# Patient Record
Sex: Female | Born: 1980 | Race: White | Hispanic: No | Marital: Married | Smoking: Never smoker
Health system: Southern US, Community
[De-identification: ages and names within clinical notes are randomized; demographics above are authoritative.]

## PROBLEM LIST (undated history)

## (undated) DIAGNOSIS — Z1501 Genetic susceptibility to malignant neoplasm of breast: Secondary | ICD-10-CM

## (undated) DIAGNOSIS — Z1509 Genetic susceptibility to other malignant neoplasm: Secondary | ICD-10-CM

## (undated) DIAGNOSIS — K120 Recurrent oral aphthae: Secondary | ICD-10-CM

## (undated) HISTORY — PX: NASAL SINUS SURGERY: SHX719

---

## 2010-02-18 ENCOUNTER — Ambulatory Visit: Payer: Self-pay | Admitting: Internal Medicine

## 2010-04-09 ENCOUNTER — Ambulatory Visit: Payer: Self-pay | Admitting: Otolaryngology

## 2011-10-22 ENCOUNTER — Inpatient Hospital Stay: Payer: Self-pay | Admitting: Obstetrics and Gynecology

## 2011-10-22 LAB — CBC WITH DIFFERENTIAL/PLATELET
Basophil %: 0.3 %
Eosinophil #: 0.1 10*3/uL (ref 0.0–0.7)
Eosinophil %: 0.9 %
HGB: 12.5 g/dL (ref 12.0–16.0)
Lymphocyte %: 24.8 %
Monocyte #: 1 10*3/uL — ABNORMAL HIGH (ref 0.0–0.7)
Monocyte %: 9.2 %
Neutrophil #: 7.4 10*3/uL — ABNORMAL HIGH (ref 1.4–6.5)
Neutrophil %: 64.8 %
Platelet: 206 10*3/uL (ref 150–440)
RBC: 3.9 10*6/uL (ref 3.80–5.20)
WBC: 11.4 10*3/uL — ABNORMAL HIGH (ref 3.6–11.0)

## 2011-10-23 LAB — COMPREHENSIVE METABOLIC PANEL
Albumin: 2.7 g/dL — ABNORMAL LOW (ref 3.4–5.0)
Anion Gap: 14 (ref 7–16)
BUN: 5 mg/dL — ABNORMAL LOW (ref 7–18)
Bilirubin,Total: 0.8 mg/dL (ref 0.2–1.0)
Chloride: 105 mmol/L (ref 98–107)
Co2: 20 mmol/L — ABNORMAL LOW (ref 21–32)
EGFR (African American): >60
EGFR (Non-African Amer.): >60
SGOT(AST): 36 U/L (ref 15–37)
SGPT (ALT): 23 U/L
Total Protein: 6.7 g/dL (ref 6.4–8.2)

## 2011-10-23 LAB — CBC
MCH: 32.3 pg (ref 26.0–34.0)
MCHC: 34.3 g/dL (ref 32.0–36.0)
MCV: 94 fL (ref 80–100)
Platelet: 201 10*3/uL (ref 150–440)
RDW: 13.6 % (ref 11.5–14.5)

## 2011-10-23 LAB — PROTEIN / CREATININE RATIO, URINE: Creatinine, Urine: 34.8 mg/dL (ref 30.0–125.0)

## 2014-07-12 LAB — OB RESULTS CONSOLE ANTIBODY SCREEN: ANTIBODY SCREEN: NEGATIVE

## 2014-07-12 LAB — OB RESULTS CONSOLE RPR: RPR: NONREACTIVE

## 2014-07-12 LAB — OB RESULTS CONSOLE HIV ANTIBODY (ROUTINE TESTING): HIV: NONREACTIVE

## 2014-07-12 LAB — OB RESULTS CONSOLE RUBELLA ANTIBODY, IGM: Rubella: IMMUNE

## 2014-07-12 LAB — OB RESULTS CONSOLE VARICELLA ZOSTER ANTIBODY, IGG: Varicella: IMMUNE

## 2014-07-12 LAB — OB RESULTS CONSOLE HEPATITIS B SURFACE ANTIGEN: Hepatitis B Surface Ag: NEGATIVE

## 2014-08-07 LAB — OB RESULTS CONSOLE ABO/RH: RH TYPE: POSITIVE

## 2014-08-07 LAB — OB RESULTS CONSOLE RUBELLA ANTIBODY, IGM: Rubella: IMMUNE

## 2014-08-07 LAB — OB RESULTS CONSOLE VARICELLA ZOSTER ANTIBODY, IGG: Varicella: IMMUNE

## 2014-08-09 ENCOUNTER — Other Ambulatory Visit: Payer: Self-pay | Admitting: Obstetrics & Gynecology

## 2014-08-09 DIAGNOSIS — Z1509 Genetic susceptibility to other malignant neoplasm: Secondary | ICD-10-CM

## 2014-08-09 DIAGNOSIS — Z1501 Genetic susceptibility to malignant neoplasm of breast: Secondary | ICD-10-CM

## 2014-08-09 DIAGNOSIS — Z803 Family history of malignant neoplasm of breast: Secondary | ICD-10-CM

## 2014-08-10 NOTE — L&D Delivery Note (Signed)
VAGINAL DELIVERY NOTE:  Date of Delivery: 01/19/2015 Primary OB: [facility]  Gestational Age/EDD: [redacted]w[redacted]d 02/22/2015, Alternate EDD Entry Antepartum complications: Twins, IUGR, 35 weeks Attending Physician: Barnett Applebaum, MD, FACOG Delivery Type: primary cesarean section, low transverse incision  Anesthesia: spinal Laceration: none Episiotomy: none Placenta: expressed Intrapartum complications: None Estimated Blood Loss: 250 mL GBS: unknown Procedure Details: Vtx/Vtx twin deliv ery by CS Baby: Liveborn female x2, Apgars 9/9, weight 4-8, 3-3

## 2014-10-11 ENCOUNTER — Encounter: Payer: Self-pay | Admitting: Maternal & Fetal Medicine

## 2014-10-18 ENCOUNTER — Encounter: Payer: Self-pay | Admitting: Obstetrics & Gynecology

## 2014-11-01 ENCOUNTER — Encounter: Payer: Self-pay | Admitting: Maternal & Fetal Medicine

## 2014-11-15 ENCOUNTER — Encounter
Admit: 2014-11-15 | Disposition: A | Discharge status: Home / Self Care | Payer: Self-pay | Attending: Maternal & Fetal Medicine | Admitting: Maternal & Fetal Medicine

## 2014-11-22 ENCOUNTER — Encounter
Admit: 2014-11-22 | Disposition: A | Discharge status: Home / Self Care | Payer: Self-pay | Attending: Obstetrics & Gynecology | Admitting: Obstetrics & Gynecology

## 2014-11-22 DIAGNOSIS — R079 Chest pain, unspecified: Secondary | ICD-10-CM | POA: Insufficient documentation

## 2014-11-22 DIAGNOSIS — R011 Cardiac murmur, unspecified: Secondary | ICD-10-CM | POA: Insufficient documentation

## 2014-11-22 DIAGNOSIS — R002 Palpitations: Secondary | ICD-10-CM | POA: Insufficient documentation

## 2014-11-23 ENCOUNTER — Other Ambulatory Visit: Payer: Self-pay | Admitting: Maternal & Fetal Medicine

## 2014-11-23 DIAGNOSIS — Z3689 Encounter for other specified antenatal screening: Secondary | ICD-10-CM

## 2014-11-23 DIAGNOSIS — O30039 Twin pregnancy, monochorionic/diamniotic, unspecified trimester: Secondary | ICD-10-CM

## 2014-11-23 DIAGNOSIS — IMO0001 Reserved for inherently not codable concepts without codable children: Secondary | ICD-10-CM

## 2014-11-27 LAB — OB RESULTS CONSOLE HGB/HCT, BLOOD
HCT: 38 %
Hemoglobin: 12.9 g/dL

## 2014-11-27 LAB — OB RESULTS CONSOLE PLATELET COUNT: Platelets: 248 10*3/uL

## 2014-11-29 ENCOUNTER — Encounter
Admit: 2014-11-29 | Disposition: A | Discharge status: Home / Self Care | Payer: Self-pay | Attending: Maternal & Fetal Medicine | Admitting: Maternal & Fetal Medicine

## 2014-12-05 ENCOUNTER — Ambulatory Visit: Payer: Self-pay | Admitting: Cardiovascular Disease

## 2014-12-06 ENCOUNTER — Encounter
Admit: 2014-12-06 | Disposition: A | Discharge status: Home / Self Care | Payer: Self-pay | Attending: Obstetrics and Gynecology | Admitting: Obstetrics and Gynecology

## 2014-12-10 ENCOUNTER — Ambulatory Visit
Admission: RE | Admit: 2014-12-10 | Discharge: 2014-12-10 | Disposition: A | Discharge status: Home / Self Care | Payer: Medicaid Other | Source: Ambulatory Visit | Attending: Maternal & Fetal Medicine | Admitting: Maternal & Fetal Medicine

## 2014-12-12 ENCOUNTER — Other Ambulatory Visit: Payer: Self-pay | Admitting: Maternal & Fetal Medicine

## 2014-12-12 DIAGNOSIS — Z3689 Encounter for other specified antenatal screening: Secondary | ICD-10-CM

## 2014-12-12 DIAGNOSIS — IMO0001 Reserved for inherently not codable concepts without codable children: Secondary | ICD-10-CM

## 2014-12-13 ENCOUNTER — Ambulatory Visit
Admission: RE | Admit: 2014-12-13 | Discharge: 2014-12-13 | Disposition: A | Discharge status: Home / Self Care | Payer: BLUE CROSS/BLUE SHIELD | Source: Ambulatory Visit | Attending: Obstetrics and Gynecology | Admitting: Obstetrics and Gynecology

## 2014-12-13 ENCOUNTER — Ambulatory Visit: Payer: BLUE CROSS/BLUE SHIELD

## 2014-12-13 VITALS — BP 115/68 | HR 83 | Temp 98.2°F | Wt 168.0 lb

## 2014-12-13 DIAGNOSIS — IMO0002 Reserved for concepts with insufficient information to code with codable children: Secondary | ICD-10-CM

## 2014-12-13 DIAGNOSIS — Z3A29 29 weeks gestation of pregnancy: Secondary | ICD-10-CM | POA: Diagnosis not present

## 2014-12-13 DIAGNOSIS — O30039 Twin pregnancy, monochorionic/diamniotic, unspecified trimester: Secondary | ICD-10-CM | POA: Diagnosis present

## 2014-12-13 DIAGNOSIS — O30033 Twin pregnancy, monochorionic/diamniotic, third trimester: Secondary | ICD-10-CM | POA: Diagnosis not present

## 2014-12-17 ENCOUNTER — Ambulatory Visit
Admission: RE | Admit: 2014-12-17 | Discharge: 2014-12-17 | Disposition: A | Discharge status: Home / Self Care | Payer: BLUE CROSS/BLUE SHIELD | Source: Ambulatory Visit | Attending: Obstetrics & Gynecology | Admitting: Obstetrics & Gynecology

## 2014-12-17 VITALS — BP 115/76 | HR 78 | Temp 98.0°F | Wt 164.0 lb

## 2014-12-17 DIAGNOSIS — Z3A30 30 weeks gestation of pregnancy: Secondary | ICD-10-CM | POA: Insufficient documentation

## 2014-12-17 DIAGNOSIS — O30033 Twin pregnancy, monochorionic/diamniotic, third trimester: Secondary | ICD-10-CM

## 2014-12-17 DIAGNOSIS — IMO0002 Reserved for concepts with insufficient information to code with codable children: Secondary | ICD-10-CM

## 2014-12-17 DIAGNOSIS — O26849 Uterine size-date discrepancy, unspecified trimester: Secondary | ICD-10-CM | POA: Insufficient documentation

## 2014-12-17 NOTE — Progress Notes (Signed)
[facility] NST: Pt presents at 30 3/7 weeks for NST in setting of monochorionic-diamniotic twin gestation with fetal growth restriction of Twin B. She reports excellent fetal movement.  Twin A: Baseline 130s, moderate variability, reactive, no decels Twin B: Unable to trace due to numerous drop-outs. Toco: No contractions.  See Viewpoint for BPPs  Farid Grigorian, [place] A, MD

## 2014-12-20 ENCOUNTER — Encounter: Payer: Self-pay | Admitting: *Deleted

## 2014-12-20 ENCOUNTER — Ambulatory Visit: Payer: BLUE CROSS/BLUE SHIELD

## 2014-12-20 ENCOUNTER — Observation Stay
Admission: RE | Admit: 2014-12-20 | Discharge: 2014-12-20 | Disposition: A | Discharge status: Home / Self Care | Payer: BLUE CROSS/BLUE SHIELD | Source: Ambulatory Visit | Attending: Obstetrics & Gynecology | Admitting: Obstetrics & Gynecology

## 2014-12-20 ENCOUNTER — Ambulatory Visit
Admission: RE | Admit: 2014-12-20 | Discharge: 2014-12-20 | Disposition: A | Discharge status: Home / Self Care | Payer: BLUE CROSS/BLUE SHIELD | Source: Ambulatory Visit | Attending: Maternal & Fetal Medicine | Admitting: Maternal & Fetal Medicine

## 2014-12-20 VITALS — BP 106/66 | HR 79 | Temp 98.1°F | Wt 165.0 lb

## 2014-12-20 DIAGNOSIS — O30039 Twin pregnancy, monochorionic/diamniotic, unspecified trimester: Secondary | ICD-10-CM

## 2014-12-20 DIAGNOSIS — Z3490 Encounter for supervision of normal pregnancy, unspecified, unspecified trimester: Secondary | ICD-10-CM

## 2014-12-20 DIAGNOSIS — IMO0001 Reserved for inherently not codable concepts without codable children: Secondary | ICD-10-CM

## 2014-12-20 DIAGNOSIS — Z3A30 30 weeks gestation of pregnancy: Secondary | ICD-10-CM | POA: Insufficient documentation

## 2014-12-20 DIAGNOSIS — IMO0002 Reserved for concepts with insufficient information to code with codable children: Secondary | ICD-10-CM

## 2014-12-20 DIAGNOSIS — O30033 Twin pregnancy, monochorionic/diamniotic, third trimester: Secondary | ICD-10-CM | POA: Diagnosis present

## 2014-12-20 NOTE — OB Triage Provider Note (Signed)
Progress Note Decreased Fetal Movement.  Subjective:   Michelle Cervantes is a 34 y.o. female. She is at [redacted]w[redacted]d gestation with mono/di twins.  She was at [facility] today for eval and had an 8/10 BPP for baby A (NR NST) and 6/10 BPP for Baby B (breathing, NR NST). She also reports decreased fetal movement of baby B. Denies no complaints and bleeding, contractions, cramping or leaking.  Incidentally, shd had her right big toenail removed yesterday. Her pregnancy has been complicated by: mono/di twinning   PMH, PSH, POBH and problem list reviewed Medications and allergies reviewed.    Objective:   General appearance: alert, well appearing, and in no distress, oriented to person, place, and time and normal appearing weight. External fetal monitoring: 10x10 accelerations (more than one) seen in both fetal strips, baby A baseline: 140, baby B baseline: 140 Ultrasound: BPP Baby A 10/10 (NST with 10x10 accelerations), Baby B (NST with 10x10 accelerations), baby B with ample activity. No results found for this or any previous visit (from the past 48 hour(s)).   Assessment:   Pregnancy at [redacted]w[redacted]d with concerns for decreased fetal movement. Evaluation reveals: normal fetal movement   Plan:  Discharge home Keep next appointment   Patient expresses understanding of information provided and plan of care.     Ward, Honor Loh, MD

## 2014-12-20 NOTE — Plan of Care (Signed)
Discharge instructions, both oral and written, given to pt. She has next OB appt Monday at [facility]. Pt leaving dept in stable condition ambulatory in stable condition.

## 2014-12-20 NOTE — Discharge Instructions (Signed)
Braxton Hicks Contractions °Contractions of the uterus can occur throughout pregnancy. Contractions are not always a sign that you are in labor.  °WHAT ARE BRAXTON HICKS CONTRACTIONS?  °Contractions that occur before labor are called Braxton Hicks contractions, or false labor. Toward the end of pregnancy (32-34 weeks), these contractions can develop more often and may become more forceful. This is not true labor because these contractions do not result in opening (dilatation) and thinning of the cervix. They are sometimes difficult to tell apart from true labor because these contractions can be forceful and people have different pain tolerances. You should not feel embarrassed if you go to the hospital with false labor. Sometimes, the only way to tell if you are in true labor is for your health care provider to look for changes in the cervix. °If there are no prenatal problems or other health problems associated with the pregnancy, it is completely safe to be sent home with false labor and await the onset of true labor. °HOW CAN YOU TELL THE DIFFERENCE BETWEEN TRUE AND FALSE LABOR? °False Labor °· The contractions of false labor are usually shorter and not as hard as those of true labor.   °· The contractions are usually irregular.   °· The contractions are often felt in the front of the lower abdomen and in the groin.   °· The contractions may go away when you walk around or change positions while lying down.   °· The contractions get weaker and are shorter lasting as time goes on.   °· The contractions do not usually become progressively stronger, regular, and closer together as with true labor.   °True Labor °· Contractions in true labor last 30-70 seconds, become very regular, usually become more intense, and increase in frequency.   °· The contractions do not go away with walking.   °· The discomfort is usually felt in the top of the uterus and spreads to the lower abdomen and low back.   °· True labor can be  determined by your health care provider with an exam. This will show that the cervix is dilating and getting thinner.   °WHAT TO REMEMBER °· Keep up with your usual exercises and follow other instructions given by your health care provider.   °· Take medicines as directed by your health care provider.   °· Keep your regular prenatal appointments.   °· Eat and drink lightly if you think you are going into labor.   °· If Braxton Hicks contractions are making you uncomfortable:   °¨ Change your position from lying down or resting to walking, or from walking to resting.   °¨ Sit and rest in a tub of warm water.   °¨ Drink 2-3 glasses of water. Dehydration may cause these contractions.   °¨ Do slow and deep breathing several times an hour.   °WHEN SHOULD I SEEK IMMEDIATE MEDICAL CARE? °Seek immediate medical care if: °· Your contractions become stronger, more regular, and closer together.   °· You have fluid leaking or gushing from your vagina.   °· You have a fever.   °· You pass blood-tinged mucus.   °· You have vaginal bleeding.   °· You have continuous abdominal pain.   °· You have low back pain that you never had before.   °· You feel your baby's head pushing down and causing pelvic pressure.   °· Your baby is not moving as much as it used to.   °Document Released: 07/27/2005 Document Revised: 08/01/2013 Document Reviewed: 05/08/2013 °ExitCare® Patient Information ©2015 [employer]. This information is not intended to replace advice given to you by your health care   provider. Make sure you discuss any questions you have with your health care provider. °Fetal Movement Counts °Patient Name: __________________________________________________ Patient Due Date: ____________________ °Performing a fetal movement count is highly recommended in high-risk pregnancies, but it is good for every pregnant woman to do. Your health care provider may ask you to start counting fetal movements at 28 weeks of the pregnancy. Fetal  movements often increase: °· After eating a full meal. °· After physical activity. °· After eating or drinking something sweet or cold. °· At rest. °Pay attention to when you feel the baby is most active. This will help you notice a pattern of your baby's sleep and wake cycles and what factors contribute to an increase in fetal movement. It is important to perform a fetal movement count at the same time each day when your baby is normally most active.  °HOW TO COUNT FETAL MOVEMENTS °· Find a quiet and comfortable area to sit or lie down on your left side. Lying on your left side provides the best blood and oxygen circulation to your baby. °· Write down the day and time on a sheet of paper or in a journal. °· Start counting kicks, flutters, swishes, rolls, or jabs in a 2-hour period. You should feel at least 10 movements within 2 hours. °· If you do not feel 10 movements in 2 hours, wait 2-3 hours and count again. Look for a change in the pattern or not enough counts in 2 hours. °SEEK MEDICAL CARE IF: °· You feel less than 10 counts in 2 hours, tried twice. °· There is no movement in over an hour. °· The pattern is changing or taking longer each day to reach 10 counts in 2 hours. °· You feel the baby is not moving as he or she usually does. °Date: ____________ Movements: ____________ Start time: ____________ Finish time: ____________  °Date: ____________ Movements: ____________ Start time: ____________ Finish time: ____________ °Date: ____________ Movements: ____________ Start time: ____________ Finish time: ____________ °Date: ____________ Movements: ____________ Start time: ____________ Finish time: ____________ °Date: ____________ Movements: ____________ Start time: ____________ Finish time: ____________ °Date: ____________ Movements: ____________ Start time: ____________ Finish time: ____________ °Date: ____________ Movements: ____________ Start time: ____________ Finish time: ____________ °Date: ____________  Movements: ____________ Start time: ____________ Finish time: ____________  °Date: ____________ Movements: ____________ Start time: ____________ Finish time: ____________ °Date: ____________ Movements: ____________ Start time: ____________ Finish time: ____________ °Date: ____________ Movements: ____________ Start time: ____________ Finish time: ____________ °Date: ____________ Movements: ____________ Start time: ____________ Finish time: ____________ °Date: ____________ Movements: ____________ Start time: ____________ Finish time: ____________ °Date: ____________ Movements: ____________ Start time: ____________ Finish time: ____________ °Date: ____________ Movements: ____________ Start time: ____________ Finish time: ____________  °Date: ____________ Movements: ____________ Start time: ____________ Finish time: ____________ °Date: ____________ Movements: ____________ Start time: ____________ Finish time: ____________ °Date: ____________ Movements: ____________ Start time: ____________ Finish time: ____________ °Date: ____________ Movements: ____________ Start time: ____________ Finish time: ____________ °Date: ____________ Movements: ____________ Start time: ____________ Finish time: ____________ °Date: ____________ Movements: ____________ Start time: ____________ Finish time: ____________ °Date: ____________ Movements: ____________ Start time: ____________ Finish time: ____________  °Date: ____________ Movements: ____________ Start time: ____________ Finish time: ____________ °Date: ____________ Movements: ____________ Start time: ____________ Finish time: ____________ °Date: ____________ Movements: ____________ Start time: ____________ Finish time: ____________ °Date: ____________ Movements: ____________ Start time: ____________ Finish time: ____________ °Date: ____________ Movements: ____________ Start time: ____________ Finish time: ____________ °Date: ____________ Movements: ____________ Start time:  ____________ Finish time: ____________ °Date: ____________ Movements: ____________   Start time: ____________ Finish time: ____________  °Date: ____________ Movements: ____________ Start time: ____________ Finish time: ____________ °Date: ____________ Movements: ____________ Start time: ____________ Finish time: ____________ °Date: ____________ Movements: ____________ Start time: ____________ Finish time: ____________ °Date: ____________ Movements: ____________ Start time: ____________ Finish time: ____________ °Date: ____________ Movements: ____________ Start time: ____________ Finish time: ____________ °Date: ____________ Movements: ____________ Start time: ____________ Finish time: ____________ °Date: ____________ Movements: ____________ Start time: ____________ Finish time: ____________  °Date: ____________ Movements: ____________ Start time: ____________ Finish time: ____________ °Date: ____________ Movements: ____________ Start time: ____________ Finish time: ____________ °Date: ____________ Movements: ____________ Start time: ____________ Finish time: ____________ °Date: ____________ Movements: ____________ Start time: ____________ Finish time: ____________ °Date: ____________ Movements: ____________ Start time: ____________ Finish time: ____________ °Date: ____________ Movements: ____________ Start time: ____________ Finish time: ____________ °Date: ____________ Movements: ____________ Start time: ____________ Finish time: ____________  °Date: ____________ Movements: ____________ Start time: ____________ Finish time: ____________ °Date: ____________ Movements: ____________ Start time: ____________ Finish time: ____________ °Date: ____________ Movements: ____________ Start time: ____________ Finish time: ____________ °Date: ____________ Movements: ____________ Start time: ____________ Finish time: ____________ °Date: ____________ Movements: ____________ Start time: ____________ Finish time: ____________ °Date:  ____________ Movements: ____________ Start time: ____________ Finish time: ____________ °Date: ____________ Movements: ____________ Start time: ____________ Finish time: ____________  °Date: ____________ Movements: ____________ Start time: ____________ Finish time: ____________ °Date: ____________ Movements: ____________ Start time: ____________ Finish time: ____________ °Date: ____________ Movements: ____________ Start time: ____________ Finish time: ____________ °Date: ____________ Movements: ____________ Start time: ____________ Finish time: ____________ °Date: ____________ Movements: ____________ Start time: ____________ Finish time: ____________ °Date: ____________ Movements: ____________ Start time: ____________ Finish time: ____________ °Document Released: 08/26/2006 Document Revised: 12/11/2013 Document Reviewed: 05/23/2012 °ExitCare® Patient Information ©2015 [employer]. This information is not intended to replace advice given to you by your health care provider. Make sure you discuss any questions you have with your health care provider. ° °

## 2014-12-24 ENCOUNTER — Ambulatory Visit
Admission: RE | Admit: 2014-12-24 | Discharge: 2014-12-24 | Disposition: A | Discharge status: Home / Self Care | Payer: BLUE CROSS/BLUE SHIELD | Source: Ambulatory Visit | Attending: Maternal & Fetal Medicine | Admitting: Maternal & Fetal Medicine

## 2014-12-24 VITALS — BP 116/72 | HR 84 | Temp 98.1°F | Wt 167.0 lb

## 2014-12-24 DIAGNOSIS — O26849 Uterine size-date discrepancy, unspecified trimester: Secondary | ICD-10-CM | POA: Diagnosis not present

## 2014-12-24 DIAGNOSIS — O30033 Twin pregnancy, monochorionic/diamniotic, third trimester: Secondary | ICD-10-CM

## 2014-12-24 DIAGNOSIS — IMO0002 Reserved for concepts with insufficient information to code with codable children: Secondary | ICD-10-CM

## 2014-12-24 NOTE — Progress Notes (Signed)
NSTs reviewed, reactive x 2

## 2014-12-27 ENCOUNTER — Other Ambulatory Visit: Payer: Self-pay | Admitting: Obstetrics and Gynecology

## 2014-12-27 ENCOUNTER — Other Ambulatory Visit: Payer: Self-pay

## 2014-12-27 ENCOUNTER — Ambulatory Visit
Admission: RE | Admit: 2014-12-27 | Discharge: 2014-12-27 | Disposition: A | Discharge status: Home / Self Care | Payer: BLUE CROSS/BLUE SHIELD | Source: Ambulatory Visit | Attending: Obstetrics and Gynecology | Admitting: Obstetrics and Gynecology

## 2014-12-27 ENCOUNTER — Ambulatory Visit: Payer: BLUE CROSS/BLUE SHIELD

## 2014-12-27 VITALS — BP 115/74 | HR 80 | Temp 97.6°F | Ht 62.0 in | Wt 165.8 lb

## 2014-12-27 DIAGNOSIS — IMO0001 Reserved for inherently not codable concepts without codable children: Secondary | ICD-10-CM

## 2014-12-27 DIAGNOSIS — Z3689 Encounter for other specified antenatal screening: Secondary | ICD-10-CM

## 2014-12-27 DIAGNOSIS — IMO0002 Reserved for concepts with insufficient information to code with codable children: Secondary | ICD-10-CM

## 2014-12-27 DIAGNOSIS — O30033 Twin pregnancy, monochorionic/diamniotic, third trimester: Secondary | ICD-10-CM

## 2014-12-27 DIAGNOSIS — Z3A31 31 weeks gestation of pregnancy: Secondary | ICD-10-CM | POA: Diagnosis not present

## 2014-12-27 DIAGNOSIS — O30003 Twin pregnancy, unspecified number of placenta and unspecified number of amniotic sacs, third trimester: Secondary | ICD-10-CM | POA: Insufficient documentation

## 2014-12-27 LAB — US OB FOLLOW UP

## 2014-12-27 NOTE — Progress Notes (Signed)
NST: Twin A:  Baseline 135; mod variability; 10x10 accels; no decels Twin B:  Baseline 140; mod variability; 10x10 accels; no decels Reactive for EGA Wynona Neat, MD

## 2014-12-27 NOTE — Progress Notes (Signed)
Entered twice in error.

## 2014-12-31 ENCOUNTER — Ambulatory Visit
Admission: RE | Admit: 2014-12-31 | Discharge: 2014-12-31 | Disposition: A | Discharge status: Home / Self Care | Payer: BLUE CROSS/BLUE SHIELD | Source: Ambulatory Visit | Attending: Maternal & Fetal Medicine | Admitting: Maternal & Fetal Medicine

## 2014-12-31 ENCOUNTER — Other Ambulatory Visit: Payer: BLUE CROSS/BLUE SHIELD

## 2014-12-31 VITALS — BP 111/74 | HR 81 | Temp 97.9°F | Wt 167.0 lb

## 2014-12-31 DIAGNOSIS — IMO0002 Reserved for concepts with insufficient information to code with codable children: Secondary | ICD-10-CM

## 2014-12-31 DIAGNOSIS — O30003 Twin pregnancy, unspecified number of placenta and unspecified number of amniotic sacs, third trimester: Secondary | ICD-10-CM | POA: Diagnosis present

## 2014-12-31 DIAGNOSIS — Z3A32 32 weeks gestation of pregnancy: Secondary | ICD-10-CM | POA: Diagnosis not present

## 2014-12-31 DIAGNOSIS — O30033 Twin pregnancy, monochorionic/diamniotic, third trimester: Secondary | ICD-10-CM

## 2014-12-31 DIAGNOSIS — O30009 Twin pregnancy, unspecified number of placenta and unspecified number of amniotic sacs, unspecified trimester: Secondary | ICD-10-CM

## 2015-01-03 ENCOUNTER — Ambulatory Visit
Admission: RE | Admit: 2015-01-03 | Discharge: 2015-01-03 | Disposition: A | Discharge status: Home / Self Care | Payer: BLUE CROSS/BLUE SHIELD | Source: Ambulatory Visit | Attending: Maternal & Fetal Medicine | Admitting: Maternal & Fetal Medicine

## 2015-01-03 ENCOUNTER — Other Ambulatory Visit: Payer: Medicaid Other

## 2015-01-03 DIAGNOSIS — Z3A32 32 weeks gestation of pregnancy: Secondary | ICD-10-CM | POA: Insufficient documentation

## 2015-01-03 DIAGNOSIS — Z3689 Encounter for other specified antenatal screening: Secondary | ICD-10-CM

## 2015-01-03 DIAGNOSIS — O365930 Maternal care for other known or suspected poor fetal growth, third trimester, not applicable or unspecified: Secondary | ICD-10-CM | POA: Insufficient documentation

## 2015-01-03 DIAGNOSIS — IMO0001 Reserved for inherently not codable concepts without codable children: Secondary | ICD-10-CM

## 2015-01-03 DIAGNOSIS — O30033 Twin pregnancy, monochorionic/diamniotic, third trimester: Secondary | ICD-10-CM | POA: Diagnosis not present

## 2015-01-10 ENCOUNTER — Other Ambulatory Visit: Payer: Self-pay

## 2015-01-10 ENCOUNTER — Ambulatory Visit
Admission: RE | Admit: 2015-01-10 | Discharge: 2015-01-10 | Disposition: A | Discharge status: Home / Self Care | Payer: BLUE CROSS/BLUE SHIELD | Source: Ambulatory Visit | Attending: Maternal & Fetal Medicine | Admitting: Maternal & Fetal Medicine

## 2015-01-10 ENCOUNTER — Observation Stay
Admission: RE | Admit: 2015-01-10 | Discharge: 2015-01-10 | Disposition: A | Discharge status: Home / Self Care | Payer: BLUE CROSS/BLUE SHIELD | Attending: Obstetrics and Gynecology | Admitting: Obstetrics and Gynecology

## 2015-01-10 VITALS — BP 124/89 | HR 75 | Temp 97.6°F | Wt 166.0 lb

## 2015-01-10 DIAGNOSIS — R51 Headache: Secondary | ICD-10-CM | POA: Insufficient documentation

## 2015-01-10 DIAGNOSIS — O26893 Other specified pregnancy related conditions, third trimester: Secondary | ICD-10-CM | POA: Diagnosis present

## 2015-01-10 DIAGNOSIS — IMO0001 Reserved for inherently not codable concepts without codable children: Secondary | ICD-10-CM | POA: Diagnosis present

## 2015-01-10 DIAGNOSIS — R030 Elevated blood-pressure reading, without diagnosis of hypertension: Secondary | ICD-10-CM | POA: Diagnosis not present

## 2015-01-10 DIAGNOSIS — O30003 Twin pregnancy, unspecified number of placenta and unspecified number of amniotic sacs, third trimester: Secondary | ICD-10-CM | POA: Insufficient documentation

## 2015-01-10 DIAGNOSIS — Z3A33 33 weeks gestation of pregnancy: Secondary | ICD-10-CM | POA: Insufficient documentation

## 2015-01-10 DIAGNOSIS — IMO0002 Reserved for concepts with insufficient information to code with codable children: Secondary | ICD-10-CM

## 2015-01-10 DIAGNOSIS — Z3689 Encounter for other specified antenatal screening: Secondary | ICD-10-CM

## 2015-01-10 DIAGNOSIS — O30033 Twin pregnancy, monochorionic/diamniotic, third trimester: Secondary | ICD-10-CM

## 2015-01-10 LAB — COMPREHENSIVE METABOLIC PANEL
ALT: 28 U/L (ref 14–54)
ANION GAP: 8 (ref 5–15)
AST: 30 U/L (ref 15–41)
Albumin: 3.1 g/dL — ABNORMAL LOW (ref 3.5–5.0)
Alkaline Phosphatase: 168 U/L — ABNORMAL HIGH (ref 38–126)
BILIRUBIN TOTAL: 0.9 mg/dL (ref 0.3–1.2)
BUN: 7 mg/dL (ref 6–20)
CALCIUM: 8.4 mg/dL — AB (ref 8.9–10.3)
CO2: 21 mmol/L — ABNORMAL LOW (ref 22–32)
Chloride: 107 mmol/L (ref 101–111)
Creatinine, Ser: 0.49 mg/dL (ref 0.44–1.00)
GFR calc Af Amer: >60 mL/min (ref >60)
GFR calc non Af Amer: >60 mL/min (ref >60)
GLUCOSE: 72 mg/dL (ref 65–99)
POTASSIUM: 3.7 mmol/L (ref 3.5–5.1)
Sodium: 136 mmol/L (ref 135–145)
Total Protein: 7 g/dL (ref 6.5–8.1)

## 2015-01-10 LAB — PROTEIN / CREATININE RATIO, URINE
CREATININE, URINE: 73 mg/dL
PROTEIN CREATININE RATIO: 0.18 mg/mg{creat} — AB (ref 0.00–0.15)
TOTAL PROTEIN, URINE: 13 mg/dL

## 2015-01-10 LAB — CBC
HCT: 38.5 % (ref 35.0–47.0)
Hemoglobin: 13.2 g/dL (ref 12.0–16.0)
MCH: 31.5 pg (ref 26.0–34.0)
MCHC: 34.4 g/dL (ref 32.0–36.0)
MCV: 91.5 fL (ref 80.0–100.0)
Platelets: 201 10*3/uL (ref 150–440)
RBC: 4.21 MIL/uL (ref 3.80–5.20)
RDW: 12.8 % (ref 11.5–14.5)
WBC: 9.4 10*3/uL (ref 3.6–11.0)

## 2015-01-10 MED ORDER — HYDRALAZINE HCL 20 MG/ML IJ SOLN: 5.0000 mg | INTRAMUSCULAR | Status: DC | PRN

## 2015-01-10 NOTE — Discharge Instructions (Signed)
Drink plenty of fluids, do not take extremely hot showers.

## 2015-01-10 NOTE — OB Triage Note (Signed)
Discharge instructions given with understanding - Pt states she has blurred vision when she takes a shower and then goes away-- Talked with pt at [facility]hs about the effects of hot water and BP-- pt needs to not take as hot showers and have vent. In room-- also intrusted to eat before a  taking shower- encourage to keep appt = and return back if needed for s/s of labor -- bedside US was performed by Cgutierrez- reviewed strip- babies looked good and twin b even had a few 15x15's

## 2015-01-10 NOTE — Progress Notes (Addendum)
34 year old G3 P2002, EDC=02/22/2015 by LMP=05/18/2014, now 33.6 weeks with mono-di twins sent from the [facility] clinic with elevated blood pressure. Here for [facility] evaluation. Has headache currently, but has been having more hypotensive sx at home: especially in the shower-she sees white spots and gets nauseous. Babies are active. [facility] co-managed by [facility] and DP for concerns with discordant growth. The last noted ultrasound on 12/06/2014 had EFW Twin A at the 16th% (1178Gms) and Twin B at 5th% (963gms). NSTs have been NR per patient (usually only 10x10 accels, but Doppler studies have been WNL.  Was at [facility] clinic for BPP today-had a 6/8 with no breathing-but was not evaluated for a full 30 min with ultrasound due to elevated blood pressures.   Exam: Alert, in good spirits, in no acute distress BP: 114/66, 105/61, , 117/76 Twin A: FHR 135 with accels to 150, moderate variability, no decels Twin B 135-140 with accels to 150s, moderate variability, no decels  Toco; occasional contraction  CBC    Component Value Date/Time   WBC 9.4 01/10/2015 1300   WBC 18.5* 10/23/2011 1815   RBC 4.21 01/10/2015 1300   RBC 4.09 10/23/2011 1815   HGB 13.2 01/10/2015 1300   HGB 13.2 10/23/2011 1815   HCT 38.5 01/10/2015 1300   HCT 36.0 10/24/2011 0810   PLT 201 01/10/2015 1300   PLT 201 10/23/2011 1815   MCV 91.5 01/10/2015 1300   MCV 94 10/23/2011 1815   MCH 31.5 01/10/2015 1300   MCH 32.3 10/23/2011 1815   MCHC 34.4 01/10/2015 1300   MCHC 34.3 10/23/2011 1815   RDW 12.8 01/10/2015 1300   RDW 13.6 10/23/2011 1815   LYMPHSABS 2.8 10/22/2011 2212   MONOABS 1.0* 10/22/2011 2212   EOSABS 0.1 10/22/2011 2212   BASOSABS 0.0 10/22/2011 2212   BUN/cr=7/0.49, AST and ALT WNL. Protein/cr ration less than 100 mgm  A: Twin gestation at 33.6 weeks with no evidence of preeclampsia-normotensive and normal labs FHR reactive x 2  P: DC home. FU in 4 days for APTing Continue FKCs  Dalia Heading, CNM

## 2015-01-14 ENCOUNTER — Ambulatory Visit
Admission: RE | Admit: 2015-01-14 | Discharge: 2015-01-14 | Disposition: A | Discharge status: Home / Self Care | Payer: BLUE CROSS/BLUE SHIELD | Source: Ambulatory Visit | Attending: Obstetrics & Gynecology | Admitting: Obstetrics & Gynecology

## 2015-01-14 ENCOUNTER — Other Ambulatory Visit: Payer: BLUE CROSS/BLUE SHIELD

## 2015-01-14 VITALS — BP 116/74 | HR 82 | Temp 97.9°F | Wt 167.0 lb

## 2015-01-14 DIAGNOSIS — O30033 Twin pregnancy, monochorionic/diamniotic, third trimester: Secondary | ICD-10-CM | POA: Diagnosis not present

## 2015-01-14 DIAGNOSIS — IMO0001 Reserved for inherently not codable concepts without codable children: Secondary | ICD-10-CM

## 2015-01-14 DIAGNOSIS — O365930 Maternal care for other known or suspected poor fetal growth, third trimester, not applicable or unspecified: Secondary | ICD-10-CM | POA: Insufficient documentation

## 2015-01-14 DIAGNOSIS — Z3A34 34 weeks gestation of pregnancy: Secondary | ICD-10-CM | POA: Insufficient documentation

## 2015-01-14 DIAGNOSIS — IMO0002 Reserved for concepts with insufficient information to code with codable children: Secondary | ICD-10-CM

## 2015-01-17 ENCOUNTER — Ambulatory Visit
Admission: RE | Admit: 2015-01-17 | Discharge: 2015-01-17 | Disposition: A | Discharge status: Home / Self Care | Payer: BLUE CROSS/BLUE SHIELD | Source: Ambulatory Visit | Attending: Maternal & Fetal Medicine | Admitting: Maternal & Fetal Medicine

## 2015-01-17 ENCOUNTER — Inpatient Hospital Stay
Admission: RE | Admit: 2015-01-17 | Discharge: 2015-01-17 | Disposition: A | Discharge status: Home / Self Care | Payer: BLUE CROSS/BLUE SHIELD | Source: Home / Self Care | Attending: Obstetrics & Gynecology | Admitting: Obstetrics & Gynecology

## 2015-01-17 ENCOUNTER — Inpatient Hospital Stay
Admission: RE | Admit: 2015-01-17 | Discharge: 2015-01-17 | Disposition: A | Discharge status: Home / Self Care | Payer: Self-pay | Source: Ambulatory Visit | Attending: Maternal & Fetal Medicine | Admitting: Maternal & Fetal Medicine

## 2015-01-17 DIAGNOSIS — IMO0002 Reserved for concepts with insufficient information to code with codable children: Secondary | ICD-10-CM

## 2015-01-17 DIAGNOSIS — Z3A34 34 weeks gestation of pregnancy: Secondary | ICD-10-CM | POA: Diagnosis not present

## 2015-01-17 DIAGNOSIS — O30039 Twin pregnancy, monochorionic/diamniotic, unspecified trimester: Secondary | ICD-10-CM | POA: Diagnosis present

## 2015-01-17 DIAGNOSIS — Z36 Encounter for antenatal screening of mother: Secondary | ICD-10-CM | POA: Insufficient documentation

## 2015-01-17 DIAGNOSIS — O30033 Twin pregnancy, monochorionic/diamniotic, third trimester: Secondary | ICD-10-CM | POA: Diagnosis present

## 2015-01-17 DIAGNOSIS — IMO0001 Reserved for inherently not codable concepts without codable children: Secondary | ICD-10-CM

## 2015-01-17 DIAGNOSIS — Z3689 Encounter for other specified antenatal screening: Secondary | ICD-10-CM

## 2015-01-17 MED ORDER — BETAMETHASONE SOD PHOS & ACET 6 (3-3) MG/ML IJ SUSP
INTRAMUSCULAR | Status: AC
  Administered 2015-01-17: 12 mg via INTRAMUSCULAR
  Filled 2015-01-17: qty 1

## 2015-01-17 MED ORDER — BETAMETHASONE SOD PHOS & ACET 6 (3-3) MG/ML IJ SUSP
12.0000 mg | INTRAMUSCULAR | Status: DC
  Administered 2015-01-17: 12 mg via INTRAMUSCULAR

## 2015-01-17 NOTE — H&P (Signed)
Obstetric Preoperative History and Physical  Michelle Cervantes is a 34 y.o. [id number] with IUP at [redacted]w[redacted]d presenting for presenting for scheduled cesarean section.  Mo-Di Twins; Intrauterine fetal Growth restriction.   Prenatal Course Source of Care: [facility]  with onset of care at 6 weeks Pregnancy complications or risks: Patient Active Problem List   Diagnosis Date Noted  . Elevated blood pressure 01/10/2015  . Monochorionic diamniotic twin gestation in third trimester   . Pregnancy 12/20/2014  . Monochorionic diamniotic twin gestation 12/13/2014  . FGR Twin B  12/13/2014   She plans to breastfeed She desires bilateral tubal ligation vs vasectomy for postpartum contraception.   Prenatal labs and studies: ABO, Rh: O/Positive/-- (12/29 0000) Antibody:   Rubella: Immune (12/29 0000) RPR:    HBsAg:    HIV:    GBS:  1 hr Glucola  normal Genetic screening normal Anatomy US normal  Prenatal Transfer Tool  Maternal Diabetes: No Genetic Screening: Normal Maternal Ultrasounds/Referrals: Abnormal:  Findings:   IUGR Fetal Ultrasounds or other Referrals:  Referred to Materal Fetal Medicine  Maternal Substance Abuse:  No Significant Maternal Medications:  None Significant Maternal Lab Results: Lab values include: Other: none  Past Medical History  Diagnosis Date  . Heart murmur     Past Surgical History  Procedure Laterality Date  . Nose surgery      OB History  Gravida Para Term Preterm AB SAB TAB Ectopic Multiple Living  3 2 2  0 0 0 0 0 0 2    # Outcome Date GA Lbr Len/2nd Weight Sex Delivery Anes PTL Lv  3 Current           2 Term 10/23/11 [redacted]w[redacted]d   F  EPI N Y  1 Term 10/17/07 [redacted]w[redacted]d  3.317 kg (7 lb 5 oz) F Vag-Spont EPI N Y    Obstetric Comments  Mono/di twins  [facility] 7/15    History   Social History  . Marital Status: Married    Spouse Name: N/A  . Number of Children: N/A  . Years of Education: N/A   Social History Main Topics  . Smoking  status: Never Smoker   . Smokeless tobacco: Never Used  . Alcohol Use: No  . Drug Use: No  . Sexual Activity: Not Currently    Birth Control/ Protection: Surgical   Other Topics Concern  . Not on file   Social History Narrative    Family History  Problem Relation Age of Onset  . Arthritis Mother   . Heart disease Mother   . Hypertension Mother   . Miscarriages / [place] Mother   . Vision loss Mother   . Alcohol abuse Father   . Arthritis Father   . Cancer Father   . Hyperlipidemia Father   . Vision loss Father   . Varicose Veins Father   . Hyperlipidemia Brother   . Vision loss Brother   . Cancer Maternal Aunt   . Diabetes Maternal Aunt   . Heart disease Maternal Aunt   . Kidney disease Maternal Aunt   . Diabetes Maternal Uncle   . Cancer Paternal Aunt   . Early death Paternal 17   . Varicose Veins Paternal Aunt   . Diabetes Maternal Grandmother   . Cancer Maternal Grandfather   . Stroke Paternal Grandmother   . Vision loss Paternal Grandmother   . Varicose Veins Paternal Grandmother   . Alcohol abuse Paternal Grandfather   . Cancer Paternal Grandfather      (  Not in a hospital admission)  Allergies  Allergen Reactions  . Ceclor [Cefaclor] Rash  . Duravent Dm [Phenylephrine-Dm-Gg] Rash  . Macrolides And Ketolides Rash  . Penicillins Rash  . Sulfa Antibiotics Rash    Review of Systems: Negative except for what is mentioned in HPI.  Physical Exam: LMP 05/18/2014 FHR by Doppler: 150s x2 bpm GENERAL: Well-developed, well-nourished female in no acute distress.  LUNGS: Clear to auscultation bilaterally.  HEART: Regular rate and rhythm. ABDOMEN: Soft, nontender, nondistended, gravid. PELVIC: Deferred EXTREMITIES: Nontender, no edema, 2+ distal pulses.   Assessment and Plan :Michelle Cervantes is a 34 y.o. G3P2002 at [redacted]w[redacted]d being admitted being admitted for scheduled cesarean section, planned for 01/19/15 after steroids for fetal lung maturity on 6/9  and 6/10. The risks of cesarean section discussed with the patient included but were not limited to: bleeding which may require transfusion or reoperation; infection which may require antibiotics; injury to bowel, bladder, ureters or other surrounding organs; injury to the fetus; need for additional procedures including hysterectomy in the event of a life-threatening hemorrhage; placental abnormalities wth subsequent pregnancies, incisional problems, thromboembolic phenomenon and other postoperative/anesthesia complications. The patient concurred with the proposed plan, giving informed written consent for the procedure. Patient  will remain NPO for procedure. Anesthesia and OR aware. Preoperative prophylactic antibiotics and SCDs ordered on call to the OR. To OR when ready.

## 2015-01-18 ENCOUNTER — Inpatient Hospital Stay
Admission: RE | Admit: 2015-01-18 | Discharge: 2015-01-18 | Disposition: A | Discharge status: Home / Self Care | Payer: BLUE CROSS/BLUE SHIELD | Source: Home / Self Care | Attending: Certified Nurse Midwife | Admitting: Certified Nurse Midwife

## 2015-01-18 ENCOUNTER — Encounter: Payer: Self-pay | Admitting: Anesthesiology

## 2015-01-18 DIAGNOSIS — O30033 Twin pregnancy, monochorionic/diamniotic, third trimester: Secondary | ICD-10-CM | POA: Diagnosis present

## 2015-01-18 DIAGNOSIS — O365930 Maternal care for other known or suspected poor fetal growth, third trimester, not applicable or unspecified: Principal | ICD-10-CM | POA: Diagnosis present

## 2015-01-18 DIAGNOSIS — Z3A35 35 weeks gestation of pregnancy: Secondary | ICD-10-CM | POA: Diagnosis present

## 2015-01-18 DIAGNOSIS — R030 Elevated blood-pressure reading, without diagnosis of hypertension: Secondary | ICD-10-CM | POA: Diagnosis present

## 2015-01-18 DIAGNOSIS — D62 Acute posthemorrhagic anemia: Secondary | ICD-10-CM | POA: Diagnosis present

## 2015-01-18 DIAGNOSIS — O09523 Supervision of elderly multigravida, third trimester: Secondary | ICD-10-CM

## 2015-01-18 MED ORDER — BETAMETHASONE SOD PHOS & ACET 6 (3-3) MG/ML IJ SUSP
12.5000 mg | Freq: Once | INTRAMUSCULAR | Status: AC
  Administered 2015-01-18: 12.5 mg via INTRAMUSCULAR

## 2015-01-18 NOTE — Anesthesia Preprocedure Evaluation (Addendum)
Anesthesia Evaluation  Patient identified by MRN, date of birth, ID band Patient awake    Reviewed: Allergy & Precautions, NPO status , Patient's Chart, lab work & pertinent test results  Airway Mallampati: II       Dental  (+) Chipped   Pulmonary          Cardiovascular + Valvular Problems/Murmurs     Neuro/Psych    GI/Hepatic   Endo/Other    Renal/GU      Musculoskeletal   Abdominal   Peds  Hematology   Anesthesia Other Findings   Reproductive/Obstetrics (+) Pregnancy                             Anesthesia Physical Anesthesia Plan  ASA: II  Anesthesia Plan: Spinal   Post-op Pain Management:    Induction:   Airway Management Planned:   Additional Equipment:   Intra-op Plan:   Post-operative Plan:   Informed Consent: I have reviewed the patients History and Physical, chart, labs and discussed the procedure including the risks, benefits and alternatives for the proposed anesthesia with the patient or authorized representative who has indicated his/her understanding and acceptance.     Plan Discussed with: CRNA  Anesthesia Plan Comments:         Anesthesia Quick Evaluation

## 2015-01-18 NOTE — Progress Notes (Signed)
Patient ID: Michelle Cervantes, female   DOB: 12/02/1980, 34 y.o.   MRN: [id number]   Pt seen today in L&D for her second dose of betamethasone given by nursing staff. Pt for scheduled c/s tomorrow.

## 2015-01-19 ENCOUNTER — Inpatient Hospital Stay: Payer: BLUE CROSS/BLUE SHIELD | Admitting: Anesthesiology

## 2015-01-19 ENCOUNTER — Encounter: Payer: Self-pay | Admitting: Advanced Practice Midwife

## 2015-01-19 ENCOUNTER — Inpatient Hospital Stay
Admission: RE | Admit: 2015-01-19 | Discharge: 2015-01-22 | DRG: 765 | Disposition: A | Discharge status: Home / Self Care | Payer: BLUE CROSS/BLUE SHIELD | Attending: Obstetrics & Gynecology | Admitting: Obstetrics & Gynecology

## 2015-01-19 ENCOUNTER — Inpatient Hospital Stay
Admission: RE | Admit: 2015-01-19 | Payer: BLUE CROSS/BLUE SHIELD | Source: Ambulatory Visit | Admitting: Obstetrics & Gynecology

## 2015-01-19 ENCOUNTER — Encounter
Admission: RE | Disposition: A | Discharge status: Home / Self Care | Payer: Self-pay | Source: Home / Self Care | Attending: Obstetrics & Gynecology

## 2015-01-19 DIAGNOSIS — D62 Acute posthemorrhagic anemia: Secondary | ICD-10-CM | POA: Diagnosis present

## 2015-01-19 DIAGNOSIS — O365930 Maternal care for other known or suspected poor fetal growth, third trimester, not applicable or unspecified: Secondary | ICD-10-CM | POA: Diagnosis present

## 2015-01-19 DIAGNOSIS — O30033 Twin pregnancy, monochorionic/diamniotic, third trimester: Secondary | ICD-10-CM | POA: Diagnosis present

## 2015-01-19 DIAGNOSIS — Z3A35 35 weeks gestation of pregnancy: Secondary | ICD-10-CM | POA: Diagnosis present

## 2015-01-19 DIAGNOSIS — R030 Elevated blood-pressure reading, without diagnosis of hypertension: Secondary | ICD-10-CM | POA: Diagnosis present

## 2015-01-19 DIAGNOSIS — O09523 Supervision of elderly multigravida, third trimester: Secondary | ICD-10-CM | POA: Diagnosis present

## 2015-01-19 DIAGNOSIS — Z98891 History of uterine scar from previous surgery: Secondary | ICD-10-CM

## 2015-01-19 DIAGNOSIS — IMO0002 Reserved for concepts with insufficient information to code with codable children: Secondary | ICD-10-CM | POA: Diagnosis present

## 2015-01-19 DIAGNOSIS — O30039 Twin pregnancy, monochorionic/diamniotic, unspecified trimester: Secondary | ICD-10-CM | POA: Diagnosis present

## 2015-01-19 LAB — ABO/RH: ABO/RH(D): O POS

## 2015-01-19 LAB — CBC
HCT: 34.8 % — ABNORMAL LOW (ref 35.0–47.0)
Hemoglobin: 11.5 g/dL — ABNORMAL LOW (ref 12.0–16.0)
MCH: 30.7 pg (ref 26.0–34.0)
MCHC: 33 g/dL (ref 32.0–36.0)
MCV: 93 fL (ref 80.0–100.0)
PLATELETS: 191 10*3/uL (ref 150–440)
RBC: 3.75 MIL/uL — AB (ref 3.80–5.20)
RDW: 12.5 % (ref 11.5–14.5)
WBC: 14.5 10*3/uL — ABNORMAL HIGH (ref 3.6–11.0)

## 2015-01-19 LAB — TYPE AND SCREEN
ABO/RH(D): O POS
Antibody Screen: NEGATIVE

## 2015-01-19 SURGERY — Surgical Case
Anesthesia: Spinal | Site: Abdomen | Wound class: Clean

## 2015-01-19 MED ORDER — DIPHENHYDRAMINE HCL 25 MG PO CAPS: 25.0000 mg | ORAL_CAPSULE | Freq: Four times a day (QID) | ORAL | Status: DC | PRN

## 2015-01-19 MED ORDER — LACTATED RINGERS IV SOLN
INTRAVENOUS | Status: DC
  Administered 2015-01-19 – 2015-01-20 (×2): via INTRAVENOUS

## 2015-01-19 MED ORDER — BUPIVACAINE HCL 0.5 % IJ SOLN
10.0000 mL | Freq: Once | INTRAMUSCULAR | Status: AC
  Administered 2015-01-19: 10 mL
  Filled 2015-01-19: qty 10

## 2015-01-19 MED ORDER — NALBUPHINE HCL 10 MG/ML IJ SOLN
5.0000 mg | Freq: Once | INTRAMUSCULAR | Status: AC | PRN
  Filled 2015-01-19: qty 0.5

## 2015-01-19 MED ORDER — ACETAMINOPHEN 325 MG PO TABS: 650.0000 mg | ORAL_TABLET | ORAL | Status: DC | PRN

## 2015-01-19 MED ORDER — DIBUCAINE 1 % RE OINT: 1.0000 "application " | TOPICAL_OINTMENT | RECTAL | Status: DC | PRN

## 2015-01-19 MED ORDER — ONDANSETRON HCL 4 MG/2ML IJ SOLN
INTRAMUSCULAR | Status: DC | PRN
  Administered 2015-01-19: 4 mg via INTRAVENOUS

## 2015-01-19 MED ORDER — BUPIVACAINE HCL (PF) 0.5 % IJ SOLN
INTRAMUSCULAR | Status: AC
  Administered 2015-01-19: 10 mL
  Filled 2015-01-19: qty 30

## 2015-01-19 MED ORDER — LANOLIN HYDROUS EX OINT: 1.0000 "application " | TOPICAL_OINTMENT | CUTANEOUS | Status: DC | PRN

## 2015-01-19 MED ORDER — ZOLPIDEM TARTRATE 5 MG PO TABS: 5.0000 mg | ORAL_TABLET | Freq: Every evening | ORAL | Status: DC | PRN

## 2015-01-19 MED ORDER — OXYTOCIN 40 UNITS IN LACTATED RINGERS INFUSION - SIMPLE MED: 62.5000 mL/h | INTRAVENOUS | Status: AC

## 2015-01-19 MED ORDER — BUPIVACAINE 0.25 % ON-Q PUMP DUAL CATH 400 ML: 400.0000 mL | INJECTION | Status: DC

## 2015-01-19 MED ORDER — ONDANSETRON HCL 4 MG/2ML IJ SOLN: 4.0000 mg | Freq: Once | INTRAMUSCULAR | Status: DC | PRN

## 2015-01-19 MED ORDER — KETOROLAC TROMETHAMINE 30 MG/ML IJ SOLN
30.0000 mg | Freq: Four times a day (QID) | INTRAMUSCULAR | Status: AC | PRN
  Administered 2015-01-19 – 2015-01-20 (×4): 30 mg via INTRAVENOUS
  Filled 2015-01-19 (×4): qty 1

## 2015-01-19 MED ORDER — NALOXONE HCL 1 MG/ML IJ SOLN
1.0000 ug/kg/h | INTRAVENOUS | Status: DC | PRN
  Filled 2015-01-19: qty 2

## 2015-01-19 MED ORDER — NALBUPHINE HCL 10 MG/ML IJ SOLN
5.0000 mg | INTRAMUSCULAR | Status: DC | PRN
  Filled 2015-01-19: qty 0.5

## 2015-01-19 MED ORDER — BUPIVACAINE HCL 0.25 % IJ SOLN
INTRAMUSCULAR | Status: DC | PRN
  Administered 2015-01-19: 10 mL

## 2015-01-19 MED ORDER — DIPHENHYDRAMINE HCL 25 MG PO CAPS: 25.0000 mg | ORAL_CAPSULE | ORAL | Status: DC | PRN

## 2015-01-19 MED ORDER — OXYCODONE-ACETAMINOPHEN 5-325 MG PO TABS
1.0000 | ORAL_TABLET | ORAL | Status: DC | PRN
  Administered 2015-01-20 – 2015-01-22 (×8): 1 via ORAL
  Filled 2015-01-19 (×8): qty 1

## 2015-01-19 MED ORDER — WITCH HAZEL-GLYCERIN EX PADS: 1.0000 "application " | MEDICATED_PAD | CUTANEOUS | Status: DC | PRN

## 2015-01-19 MED ORDER — BUPIVACAINE IN DEXTROSE 0.75-8.25 % IT SOLN
INTRATHECAL | Status: DC | PRN
  Administered 2015-01-19: 1.4 mL via INTRATHECAL

## 2015-01-19 MED ORDER — MENTHOL 3 MG MT LOZG
1.0000 | LOZENGE | OROMUCOSAL | Status: DC | PRN
Start: 2015-01-19 — End: 2015-01-22
  Filled 2015-01-19: qty 9

## 2015-01-19 MED ORDER — DIPHENHYDRAMINE HCL 50 MG/ML IJ SOLN: 12.5000 mg | INTRAMUSCULAR | Status: DC | PRN

## 2015-01-19 MED ORDER — SODIUM CHLORIDE 0.9 % IJ SOLN: 3.0000 mL | INTRAMUSCULAR | Status: DC | PRN

## 2015-01-19 MED ORDER — ONDANSETRON HCL 4 MG/2ML IJ SOLN: 4.0000 mg | Freq: Three times a day (TID) | INTRAMUSCULAR | Status: DC | PRN

## 2015-01-19 MED ORDER — OXYTOCIN 40 UNITS IN LACTATED RINGERS INFUSION - SIMPLE MED
INTRAVENOUS | Status: AC
  Administered 2015-01-19: 0.04 [IU]
  Filled 2015-01-19: qty 1000

## 2015-01-19 MED ORDER — CHLORHEXIDINE GLUCONATE CLOTH 2 % EX PADS
6.0000 | MEDICATED_PAD | Freq: Every day | CUTANEOUS | Status: DC
  Administered 2015-01-19: 6 via TOPICAL

## 2015-01-19 MED ORDER — SIMETHICONE 80 MG PO CHEW
80.0000 mg | CHEWABLE_TABLET | ORAL | Status: DC
  Administered 2015-01-19: 80 mg via ORAL

## 2015-01-19 MED ORDER — KETOROLAC TROMETHAMINE 30 MG/ML IJ SOLN: 30.0000 mg | Freq: Four times a day (QID) | INTRAMUSCULAR | Status: DC | PRN

## 2015-01-19 MED ORDER — MORPHINE SULFATE 10 MG/ML IJ SOLN
INTRAMUSCULAR | Status: DC | PRN
  Administered 2015-01-19: 200 ug via INTRAVENOUS

## 2015-01-19 MED ORDER — MEPERIDINE HCL 25 MG/ML IJ SOLN: 12.5000 mg | INTRAMUSCULAR | Status: DC | PRN

## 2015-01-19 MED ORDER — SCOPOLAMINE 1 MG/3DAYS TD PT72
1.0000 | MEDICATED_PATCH | Freq: Once | TRANSDERMAL | Status: DC
  Filled 2015-01-19: qty 1

## 2015-01-19 MED ORDER — SENNOSIDES-DOCUSATE SODIUM 8.6-50 MG PO TABS
2.0000 | ORAL_TABLET | ORAL | Status: DC
  Administered 2015-01-19 – 2015-01-20 (×2): 2 via ORAL
  Filled 2015-01-19 (×2): qty 2

## 2015-01-19 MED ORDER — KETOROLAC TROMETHAMINE 30 MG/ML IJ SOLN
INTRAMUSCULAR | Status: AC
  Administered 2015-01-19: 30 mg via INTRAVENOUS
  Filled 2015-01-19: qty 1

## 2015-01-19 MED ORDER — FENTANYL CITRATE (PF) 100 MCG/2ML IJ SOLN
25.0000 ug | INTRAMUSCULAR | Status: DC | PRN
Start: 2015-01-19 — End: 2015-01-19

## 2015-01-19 MED ORDER — EPHEDRINE SULFATE 50 MG/ML IJ SOLN
INTRAMUSCULAR | Status: DC | PRN
  Administered 2015-01-19: 5 mg via INTRAVENOUS

## 2015-01-19 MED ORDER — LACTATED RINGERS IV SOLN
40.0000 [IU] | INTRAVENOUS | Status: DC | PRN
  Administered 2015-01-19: 40 [IU] via INTRAVENOUS

## 2015-01-19 MED ORDER — PRENATAL MULTIVITAMIN CH
1.0000 | ORAL_TABLET | Freq: Every day | ORAL | Status: DC
  Administered 2015-01-20 – 2015-01-21 (×2): 1 via ORAL
  Filled 2015-01-19 (×2): qty 1

## 2015-01-19 MED ORDER — SIMETHICONE 80 MG PO CHEW
80.0000 mg | CHEWABLE_TABLET | Freq: Three times a day (TID) | ORAL | Status: DC
  Administered 2015-01-20 – 2015-01-22 (×7): 80 mg via ORAL
  Filled 2015-01-19 (×8): qty 1

## 2015-01-19 MED ORDER — CITRIC ACID-SODIUM CITRATE 334-500 MG/5ML PO SOLN
30.0000 mL | Freq: Once | ORAL | Status: AC
  Administered 2015-01-19: 30 mL via ORAL

## 2015-01-19 MED ORDER — IBUPROFEN 600 MG PO TABS
600.0000 mg | ORAL_TABLET | Freq: Four times a day (QID) | ORAL | Status: DC
  Administered 2015-01-20 – 2015-01-22 (×10): 600 mg via ORAL
  Filled 2015-01-19 (×10): qty 1

## 2015-01-19 MED ORDER — CLINDAMYCIN PHOSPHATE 900 MG/50ML IV SOLN
INTRAVENOUS | Status: AC
  Administered 2015-01-19: 900 mg via INTRAVENOUS
  Filled 2015-01-19: qty 50

## 2015-01-19 MED ORDER — CITRIC ACID-SODIUM CITRATE 334-500 MG/5ML PO SOLN
ORAL | Status: AC
  Administered 2015-01-19: 30 mL via ORAL
  Filled 2015-01-19: qty 15

## 2015-01-19 MED ORDER — LACTATED RINGERS IV BOLUS (SEPSIS)
1000.0000 mL | Freq: Once | INTRAVENOUS | Status: AC
  Administered 2015-01-19: 1000 mL via INTRAVENOUS

## 2015-01-19 MED ORDER — OXYTOCIN 40 UNITS IN LACTATED RINGERS INFUSION - SIMPLE MED: INTRAVENOUS | Status: DC | PRN

## 2015-01-19 MED ORDER — NALOXONE HCL 0.4 MG/ML IJ SOLN
0.4000 mg | INTRAMUSCULAR | Status: DC | PRN
Start: 2015-01-19 — End: 2015-01-21

## 2015-01-19 MED ORDER — CLINDAMYCIN PHOSPHATE 900 MG/50ML IV SOLN
900.0000 mg | Freq: Once | INTRAVENOUS | Status: AC
  Administered 2015-01-19: 900 mg via INTRAVENOUS

## 2015-01-19 MED ORDER — LACTATED RINGERS IV SOLN
INTRAVENOUS | Status: DC
  Administered 2015-01-19 (×2): via INTRAVENOUS

## 2015-01-19 MED ORDER — ONDANSETRON HCL 4 MG/2ML IJ SOLN
INTRAMUSCULAR | Status: AC
  Filled 2015-01-19: qty 2

## 2015-01-19 MED ORDER — BUPIVACAINE 0.25 % ON-Q PUMP DUAL CATH 400 ML
INJECTION | Status: AC
  Filled 2015-01-19: qty 400

## 2015-01-19 MED ORDER — SIMETHICONE 80 MG PO CHEW
80.0000 mg | CHEWABLE_TABLET | ORAL | Status: DC | PRN
  Administered 2015-01-21: 80 mg via ORAL
  Filled 2015-01-19: qty 1

## 2015-01-19 SURGICAL SUPPLY — 22 items
CANISTER SUCT 3000ML (MISCELLANEOUS) ×3 IMPLANT
CATH KIT ON-Q SILVERSOAK 5IN (CATHETERS) ×6 IMPLANT
CHLORAPREP W/TINT 26ML (MISCELLANEOUS) ×6 IMPLANT
ELECT CAUTERY BLADE 6.4 (BLADE) ×3 IMPLANT
GLOVE SKINSENSE NS SZ8.0 LF (GLOVE) ×2
GLOVE SKINSENSE STRL SZ8.0 LF (GLOVE) ×1 IMPLANT
GOWN STRL REUS W/ TWL LRG LVL3 (GOWN DISPOSABLE) ×1 IMPLANT
GOWN STRL REUS W/ TWL XL LVL3 (GOWN DISPOSABLE) ×2 IMPLANT
GOWN STRL REUS W/TWL LRG LVL3 (GOWN DISPOSABLE) ×2
GOWN STRL REUS W/TWL XL LVL3 (GOWN DISPOSABLE) ×4
LIQUID BAND (GAUZE/BANDAGES/DRESSINGS) ×3 IMPLANT
NS IRRIG 1000ML POUR BTL (IV SOLUTION) ×3 IMPLANT
PACK C SECTION AR (MISCELLANEOUS) ×3 IMPLANT
PAD GROUND ADULT SPLIT (MISCELLANEOUS) ×3 IMPLANT
PAD OB MATERNITY 4.3X12.25 (PERSONAL CARE ITEMS) ×3 IMPLANT
PAD PREP 24X41 OB/GYN DISP (PERSONAL CARE ITEMS) ×3 IMPLANT
SUT MAXON ABS #0 GS21 30IN (SUTURE) ×6 IMPLANT
SUT VIC AB 1 CT1 36 (SUTURE) ×3 IMPLANT
SUT VIC AB 2-0 CT1 36 (SUTURE) ×3 IMPLANT
SUT VIC AB 2-0 SH 27 (SUTURE) ×2
SUT VIC AB 2-0 SH 27XBRD (SUTURE) ×1 IMPLANT
SUT VIC AB 4-0 FS2 27 (SUTURE) ×3 IMPLANT

## 2015-01-19 NOTE — Lactation Note (Signed)
Assisted mom with pumping bilaterally using her own Pump In Style Advanced Medela Shoulder Bag for 20 to 25 minutes.  Colostrum from left breast was pinkish in color d/t blood.  Colostrum from right breast was clear to yellow color without any blood.  Received 10 ml from left breast and 5 ml from right breast.  No trauma noted on nipple, so will assume was from ducts opening (stripping) from first pumping session.  Expressed colostrum labeled and taken to SCN for twins.

## 2015-01-19 NOTE — Anesthesia Procedure Notes (Signed)
Spinal Patient location during procedure: OR Start time: 01/19/2015 7:19 AM End time: 01/19/2015 7:22 AM Reason for block: at surgeon's request Staffing Resident/CRNA: Sahil Milner Performed by: resident/CRNA  Preanesthetic Checklist Completed: patient identified, site marked, surgical consent, pre-op evaluation, timeout performed, IV checked, risks and benefits discussed, monitors and equipment checked and at surgeon's request Spinal Block Patient position: sitting Prep: Betadine Patient monitoring: heart rate, cardiac monitor, continuous pulse ox and blood pressure Location: L4-5 Injection technique: single-shot Needle Needle type: Whitacre  Needle gauge: 25 G Assessment Sensory level: T10

## 2015-01-19 NOTE — Op Note (Signed)
Cesarean Section Procedure Note Indications: Twins with Intrauterine Fetal Growth Restriction, 35 weeks  Pre-operative Diagnosis: Intrauterine pregnancy [redacted]w[redacted]d ;  as above Post-operative Diagnosis: same, delivered. Procedure: Low Transverse Cesarean Section Surgeon: Barnett Applebaum, MD, FACOG Assistant(s): [place] Anesthesia: Spinal anesthesia Estimated Blood WLNL:892 mL Complications: None; patient tolerated the procedure well. Disposition: PACU - hemodynamically stable. Condition: stable  Findings: Twin A-  female infant in the cephalic presentation. Amniotic fluid - clear Birth weight 4-8lbs.  Apgars of 9 and 9.  Twin B- Female, Vertex, clear fluid, weight 3-3 lbs.  Intact placenta with a three-vessel cord. Grossly normal uterus, tubes and ovaries bilaterally. No intraabdominal adhesions were noted.  Procedure Details   The patient was taken to Operating Room, identified as the correct patient and the procedure verified as C-Section Delivery. A Time Out was held and the above information confirmed. After induction of anesthesia, the patient was draped and prepped in the usual sterile manner. A Pfannenstiel incision was made and carried down through the subcutaneous tissue to the fascia. Fascial incision was made and extended transversely with the Mayo scissors. The fascia was separated from the underlying rectus tissue superiorly and inferiorly. The peritoneum was identified and entered bluntly. Peritoneal incision was extended longitudinally. The utero-vesical peritoneal reflection was incised transversely and a bladder flap was created digitally.  A low transverse hysterotomy was made. Twin A was delivered atraumatically. The umbilical cord was clamped x2 and cut and the infant was handed to the awaiting pediatricians. Twin B was subsequently delivered in vertex presentation.  The placenta was removed intact and appeared normal with a 3-vessel cord.  The uterus was exteriorized and  cleared of all clot and debris. The hysterotomy was closed with running sutures of 0 Vicryl suture. A second imbricating layer was placed with the same suture. Excellent hemostasis was observed. The uterus was returned to the abdomen. The pelvis was irrigated and again, excellent hemostasis was noted.  The On Q Pain pump System was then placed.  Trocars were placed through the abdominal wall into the subfascial space and these were used to thread the silver soaker cathaters into place.The rectus fascia was then reapproximated with running sutures of Maxon, with careful placement not to incorporate the cathaters. Subcutaneous tissues are then irrigated with saline and hemostasis assured.  Skin is then closed with 4-0 vicryl suture in a subcuticular fashion followed by skin adhesive. The cathaters are flushed each with 5 mL of Bupivicaine and stabilized into place with dressing. Instrument, sponge, and needle counts were correct prior to the abdominal closure and at the conclusion of the case.  The patient tolerated the procedure well and was transferred to the recovery room in stable condition.

## 2015-01-19 NOTE — Transfer of Care (Signed)
Immediate Anesthesia Transfer of Care Note  Patient: Michelle Cervantes  Procedure(s) Performed: Procedure(s): CESAREAN SECTION (N/A)  Patient Location: LDR6  Anesthesia Type:Spinal  Level of Consciousness: awake, alert  and oriented  Airway & Oxygen Therapy: Patient Spontanous Breathing  Post-op Assessment: Report given to RN and Post -op Vital signs reviewed and stable  Post vital signs: Reviewed and stable  Last Vitals:  Filed Vitals:   01/19/15 0832  BP: 109/72  Pulse: 76  Temp: 36.4 C  Resp: 16    Complications: No apparent anesthesia complications

## 2015-01-19 NOTE — Discharge Summary (Signed)
Obstetric Discharge Summary Reason for Admission: cesarean section and this is for Mono Di Twins with Intrauterine Fetal Growth Restriction Prenatal Procedures: NST and ultrasound Intrapartum Procedures: cesarean: low cervical, transverse Postpartum Procedures: none Complications-Operative and Postpartum: none HEMOGLOBIN  Date Value Ref Range Status  01/20/2015 9.6* 12.0 - 16.0 g/dL Final  11/27/2014 12.9 g/dL Final   HGB  Date Value Ref Range Status  10/23/2011 13.2 12.0-16.0 g/dL Final   HCT  Date Value Ref Range Status  01/20/2015 28.2* 35.0 - 47.0 % Final  11/27/2014 38 % Final  10/24/2011 36.0 35.0-47.0 % Final    Physical Exam:  General: alert, cooperative, appears stated age and no distress Lochia: appropriate Uterine Fundus: firm Incision: healing well DVT Evaluation: No evidence of DVT seen on physical exam.  Discharge Diagnoses: C/S for mon di twins with IUGR  Discharge Information: Date: 01/22/2015 Activity: unrestricted Diet: routine Medications: PNV, Ibuprofen and Percocet Condition: stable Instructions: refer to practice specific booklet Discharge to: home Follow-up Information    Follow up with [facility]. Schedule an appointment as soon as possible for a visit in 2 days.   Why:  for BP check    Contact information:   [phone]      Newborn Data:   Jodeen, Mclin [[id number]]  Live born female  Birth Weight: 4 lb 8.3 oz (2050 g) APGAR: 8, 9   Mahsa, Hanser [[id number]]  Live born female  Birth Weight: 3 lb 3 oz (1445 g) APGAR: 9, 9  In the NICU for feeding/growing    Louisa Second 01/22/2015, 11:59 AM

## 2015-01-19 NOTE — H&P (Signed)
History and Physical Interval Note:  01/19/2015 7:03 AM  Michelle Cervantes  has presented today for surgery, with the diagnosis of TWINS- Mono-Di Twins with IUGR at 35 1/[redacted] weeks EGA.  The various methods of treatment have been discussed with the patient and family. After consideration of risks, benefits and other options for treatment, the patient has consented to  Procedure(s):  CESAREAN SECTION as a surgical intervention .  The patient's history has been reviewed, patient examined, no change in status, stable for surgery.    Pt has the following beta blocker history-  Not taking Beta Blocker.  I have reviewed the patient's chart and labs.  Questions were answered to the patient's satisfaction.    [name]

## 2015-01-20 LAB — CBC
HCT: 28.2 % — ABNORMAL LOW (ref 35.0–47.0)
Hemoglobin: 9.6 g/dL — ABNORMAL LOW (ref 12.0–16.0)
MCH: 31.3 pg (ref 26.0–34.0)
MCHC: 33.9 g/dL (ref 32.0–36.0)
MCV: 92.2 fL (ref 80.0–100.0)
Platelets: 154 10*3/uL (ref 150–440)
RBC: 3.06 MIL/uL — ABNORMAL LOW (ref 3.80–5.20)
RDW: 12.9 % (ref 11.5–14.5)
WBC: 14 10*3/uL — AB (ref 3.6–11.0)

## 2015-01-20 LAB — RPR: RPR Ser Ql: NONREACTIVE

## 2015-01-20 NOTE — Anesthesia Postprocedure Evaluation (Signed)
  Anesthesia Post-op Note  Patient: Michelle Cervantes  Procedure(s) Performed: Procedure(s): CESAREAN SECTION (N/A)  Anesthesia type:Spinal  Patient location: PACU  Post pain: Pain level controlled  Post assessment: Post-op Vital signs reviewed, Patient's Cardiovascular Status Stable, Respiratory Function Stable, Patent Airway and No signs of Nausea or vomiting  Post vital signs: Reviewed and stable  Last Vitals:  Filed Vitals:   01/20/15 0407  BP: 112/80  Pulse: 77  Temp: 36.9 C  Resp: 18    Level of consciousness: awake, alert  and patient cooperative  Complications: No apparent anesthesia complications

## 2015-01-20 NOTE — Progress Notes (Signed)
Vs stable; ambulated in hallway to SCN to see babies; tolerating PO meds; tolerating PO diet

## 2015-01-20 NOTE — Progress Notes (Signed)
  Subjective:  Doing well,minimal lochia  Objective:  Blood pressure 134/88, pulse 59, temperature 98.7 F (37.1 C), temperature source Oral, resp. rate 20, height 5\' 2"  (1.575 m), weight 75.751 kg (167 lb), last menstrual period 05/18/2014, SpO2 96 %, unknown if currently breastfeeding.  General: NAD Pulmonary: no increased work of breathing Abdomen: non-distended, non-tender, fundus firm at level of umbilicus Incision: D/C/I Extremities: no edema, no erythema, no tenderness  Results for orders placed or performed during the hospital encounter of 01/19/15 (from the past 72 hour(s))  CBC     Status: Abnormal   Collection Time: 01/19/15  5:21 AM  Result Value Ref Range   WBC 14.5 (H) 3.6 - 11.0 K/uL   RBC 3.75 (L) 3.80 - 5.20 MIL/uL   Hemoglobin 11.5 (L) 12.0 - 16.0 g/dL   HCT 34.8 (L) 35.0 - 47.0 %   MCV 93.0 80.0 - 100.0 fL   MCH 30.7 26.0 - 34.0 pg   MCHC 33.0 32.0 - 36.0 g/dL   RDW 12.5 11.5 - 14.5 %   Platelets 191 150 - 440 K/uL  Type and screen     Status: None   Collection Time: 01/19/15  5:21 AM  Result Value Ref Range   ABO/RH(D) O POS    Antibody Screen NEG    Sample Expiration 01/22/2015   RPR     Status: None   Collection Time: 01/19/15  5:21 AM  Result Value Ref Range   RPR Ser Ql Non Reactive Non Reactive    Comment: (NOTE) Performed At: [facility] [place], [place] [id number] Lindon Romp MD [id number]   ABO/Rh     Status: None   Collection Time: 01/19/15  5:22 AM  Result Value Ref Range   ABO/RH(D) O POS   CBC     Status: Abnormal   Collection Time: 01/20/15  5:28 AM  Result Value Ref Range   WBC 14.0 (H) 3.6 - 11.0 K/uL   RBC 3.06 (L) 3.80 - 5.20 MIL/uL   Hemoglobin 9.6 (L) 12.0 - 16.0 g/dL   HCT 28.2 (L) 35.0 - 47.0 %   MCV 92.2 80.0 - 100.0 fL   MCH 31.3 26.0 - 34.0 pg   MCHC 33.9 32.0 - 36.0 g/dL   RDW 12.9 11.5 - 14.5 %   Platelets 154 150 - 440 K/uL     Assessment:   34 y.o. [id number] postoperativeday # 1  LTCS for twins with IUGR   Plan:  1) Acute blood loss anemia - hemodynamically stable and asymptomatic - po ferrous sulfate  2) --/--/O POS (06/11 0522) / Rubella Immune (12/29 0000)   3) Disposition anticipate POD4

## 2015-01-21 ENCOUNTER — Ambulatory Visit: Payer: BLUE CROSS/BLUE SHIELD

## 2015-01-21 ENCOUNTER — Other Ambulatory Visit: Payer: BLUE CROSS/BLUE SHIELD

## 2015-01-21 ENCOUNTER — Encounter: Payer: Self-pay | Admitting: Obstetrics & Gynecology

## 2015-01-21 MED ORDER — DOCUSATE SODIUM 100 MG PO CAPS
100.0000 mg | ORAL_CAPSULE | Freq: Every day | ORAL | Status: DC
  Administered 2015-01-21 – 2015-01-22 (×2): 100 mg via ORAL
  Filled 2015-01-21 (×2): qty 1

## 2015-01-21 MED ORDER — BISACODYL 10 MG RE SUPP: 10.0000 mg | Freq: Every day | RECTAL | Status: DC | PRN

## 2015-01-21 MED ORDER — FERROUS SULFATE 325 (65 FE) MG PO TABS
325.0000 mg | ORAL_TABLET | Freq: Every day | ORAL | Status: DC
  Administered 2015-01-22: 325 mg via ORAL
  Filled 2015-01-21: qty 1

## 2015-01-21 MED ORDER — MEPERIDINE HCL 25 MG/ML IJ SOLN: 6.2500 mg | INTRAMUSCULAR | Status: DC | PRN

## 2015-01-21 MED ORDER — FLEET ENEMA 7-19 GM/118ML RE ENEM: 1.0000 | ENEMA | Freq: Every day | RECTAL | Status: DC | PRN

## 2015-01-21 NOTE — Progress Notes (Signed)
POD #2 LTCS for mono/di twins with FGR  S: Exhausted, did not sleep last night due to pumping and breastfeeding constantly Babies are breast and tube fed breastmilk. Passing flatus. Worried about developing constipation... Only taking percocet q8hours and Motrin q6. Voiding without difficulty. Ambulating in hallway  O:BP 136/94 mmHg  Pulse 76  Temp(Src) 98.9 F (37.2 C) (Oral)  Resp 20  Ht 5\' 2"  (1.575 m)  Wt 75.751 kg (167 lb)  BMI 30.54 kg/m2  SpO2 98%  LMP 05/18/2014  Breastfeeding? Unknown -is breastfeeding/pumping General: appears comfortable, alert Heart: RRR without murmur Lungs: CTA Abdomen: soft, BS active, Incision C+D+I On Q intact Lochia: WNL Ext: No calf tenderness CBC: hmg=9.6gm/dl and hct=28.2%. O +  A: Stable POD #2 s/p LTCS Mild diastolic elevations-possibly from lack of sleep/pain  P: Monitor blood pressures q4 Add stool softeners; can have Fleets enema or Dulcolax if needed Feso4 daily Continue ambulation Plan for sleep tonight and maybe pump q 4hrs  Delberta Folts, CNM

## 2015-01-22 LAB — SURGICAL PATHOLOGY

## 2015-01-22 MED ORDER — OXYCODONE-ACETAMINOPHEN 5-325 MG PO TABS: 1.0000 | ORAL_TABLET | ORAL | Status: DC | PRN

## 2015-01-22 MED ORDER — DOCUSATE SODIUM 100 MG PO CAPS: 100.0000 mg | ORAL_CAPSULE | Freq: Two times a day (BID) | ORAL | Status: DC

## 2015-01-22 MED ORDER — FERROUS SULFATE 325 (65 FE) MG PO TABS: 325.0000 mg | ORAL_TABLET | Freq: Two times a day (BID) | ORAL | Status: DC

## 2015-01-22 MED ORDER — IBUPROFEN 600 MG PO TABS: 600.0000 mg | ORAL_TABLET | Freq: Four times a day (QID) | ORAL | Status: DC

## 2015-01-22 NOTE — Progress Notes (Signed)
BP 136/82 reported to [place].  Patient wants to be discharged today and will have follow up BP check at [facility] on Thursday at 3:20.

## 2015-01-22 NOTE — Progress Notes (Signed)
D/C order from MD.  Reviewed d/c instructions and prescriptions with patient and answered any questions.  Patient d/c home with infant via wheelchair by nursing/auxillary. 

## 2015-01-22 NOTE — Progress Notes (Signed)
Subjective: Postpartum Day 3: Cesarean Delivery Patient reports tolerating PO and no problems voiding.    Objective: Vital signs in last 24 hours: Temp:  [97.6 F (36.4 C)-98.7 F (37.1 C)] 98 F (36.7 C) (06/14 1118) Pulse Rate:  [64-75] 75 (06/14 1118) Resp:  [18] 18 (06/14 1118) BP: (119-149)/(76-94) 124/93 mmHg (06/14 1118) SpO2:  [96 %-98 %] 97 % (06/14 0408)  Today's Vitals   01/22/15 0634 01/22/15 0730 01/22/15 0806 01/22/15 1118  BP:   149/94 124/93  Pulse:   64 75  Temp:   98.2 F (36.8 C) 98 F (36.7 C)  TempSrc:   Oral Oral  Resp:   18 18  Height:      Weight:      SpO2:      PainSc: 5  2       Physical Exam:  General: alert, cooperative, appears stated age and no distress Lochia: appropriate Uterine Fundus: firm Incision: healing well DVT Evaluation: No evidence of DVT seen on physical exam.   Recent Labs  01/20/15 0528  HGB 9.6*  HCT 28.2*    Assessment/Plan: Status post Cesarean section. Postoperative course complicated by elevated blood pressures postpartum   Continue with current care and BP monitoring. Discussed s/sx of pre-eclampsia with patient and when to let provider know.  May possibly discharge pt home depending on next set of BPs with a f/u in clinic in 2 days.   Louisa Second 01/22/2015, 11:53 AM

## 2015-01-22 NOTE — Discharge Instructions (Signed)
Follow up appointment with Louisa Second CNM at [place] on Thursday January 24, 2015 at 3:20.

## 2015-01-22 NOTE — Progress Notes (Signed)
Jaclyn Prime CNA notified RN of BP--will continue to monitor and notify rounding physician.

## 2015-01-24 ENCOUNTER — Other Ambulatory Visit: Payer: Self-pay

## 2015-01-24 ENCOUNTER — Ambulatory Visit: Payer: Self-pay

## 2015-01-24 NOTE — Lactation Note (Signed)
This note was copied from the chart of [name]. Lactation Consultation Note  Patient Name: Michelle Cervantes HALPF'X Date: 01/24/2015 Reason for consult: Follow-up assessment   Maternal Data  Post pump after each brteast feed and try each baby once a day with a pre and post weight. Will try tomorrow at 11:30 with [facility] for sibling.  Feeding Feeding Type: Breast Fed Length of feed: 20 min  LATCH Score/Interventions Latch: Grasps breast easily, tongue down, lips flanged, rhythmical sucking.  Audible Swallowing: Spontaneous and intermittent  Type of Nipple: Everted at rest and after stimulation  Comfort (Breast/Nipple): Soft / non-tender     Hold (Positioning): Assistance needed to correctly position infant at breast and maintain latch. Intervention(s): Position options;Breastfeeding basics reviewed;Skin to skin;Support Pillows  LATCH Score: 9  Lactation Tools Discussed/Used     Consult Status   ongoing   Daryel November 01/24/2015, 2:21 PM

## 2015-01-28 ENCOUNTER — Other Ambulatory Visit: Payer: BLUE CROSS/BLUE SHIELD

## 2015-01-31 ENCOUNTER — Other Ambulatory Visit: Payer: Self-pay

## 2015-01-31 ENCOUNTER — Other Ambulatory Visit: Payer: BLUE CROSS/BLUE SHIELD

## 2015-02-14 ENCOUNTER — Ambulatory Visit: Payer: Self-pay

## 2015-02-14 NOTE — Lactation Note (Signed)
This note was copied from the chart of [place]. ooLactation Consultation Note  Patient Name: Michelle Cervantes Today's Date: 02/14/2015     Maternal Data   Mother has been given information about Dr Duayne Cal for follow up with breast mastitis. Feeding Feeding Type: Bottle Fed - Breast Milk Nipple Type: Slow - flow Length of feed: 25 min  LATCH Score/Interventions                      Lactation Tools Discussed/Used Tools: Bottle   Consult Status      Daryel November 02/14/2015, 12:04 PM

## 2015-06-13 ENCOUNTER — Telehealth: Payer: Self-pay | Admitting: Gastroenterology

## 2015-06-13 NOTE — Telephone Encounter (Signed)
triage

## 2015-06-14 NOTE — Telephone Encounter (Signed)
LVM for pt to return my call.

## 2015-06-17 ENCOUNTER — Telehealth: Payer: Self-pay | Admitting: Gastroenterology

## 2015-06-17 NOTE — Telephone Encounter (Signed)
Patient returned your call regarding colonoscopy triage

## 2015-06-21 NOTE — Telephone Encounter (Signed)
Phoned patient, Lm to contact office

## 2015-06-26 NOTE — Telephone Encounter (Signed)
Cant reach patient will mail letter to contact office

## 2015-10-29 ENCOUNTER — Other Ambulatory Visit: Payer: Self-pay | Admitting: Obstetrics & Gynecology

## 2015-10-29 DIAGNOSIS — Z1231 Encounter for screening mammogram for malignant neoplasm of breast: Secondary | ICD-10-CM

## 2015-11-08 ENCOUNTER — Ambulatory Visit: Payer: BLUE CROSS/BLUE SHIELD

## 2015-11-13 ENCOUNTER — Ambulatory Visit
Admission: RE | Admit: 2015-11-13 | Discharge: 2015-11-13 | Disposition: A | Discharge status: Home / Self Care | Payer: BLUE CROSS/BLUE SHIELD | Source: Ambulatory Visit | Attending: Obstetrics & Gynecology | Admitting: Obstetrics & Gynecology

## 2015-11-13 DIAGNOSIS — Z1231 Encounter for screening mammogram for malignant neoplasm of breast: Secondary | ICD-10-CM | POA: Insufficient documentation

## 2015-11-13 HISTORY — DX: Genetic susceptibility to malignant neoplasm of breast: Z15.01

## 2015-11-13 HISTORY — DX: Genetic susceptibility to other malignant neoplasm: Z15.09

## 2015-11-18 ENCOUNTER — Encounter
Admission: RE | Admit: 2015-11-18 | Discharge: 2015-11-18 | Disposition: A | Discharge status: Home / Self Care | Payer: BLUE CROSS/BLUE SHIELD | Source: Ambulatory Visit | Attending: Obstetrics & Gynecology | Admitting: Obstetrics & Gynecology

## 2015-11-18 DIAGNOSIS — Z01812 Encounter for preprocedural laboratory examination: Secondary | ICD-10-CM | POA: Insufficient documentation

## 2015-11-18 LAB — CBC
HEMATOCRIT: 39.8 % (ref 35.0–47.0)
HEMOGLOBIN: 13.4 g/dL (ref 12.0–16.0)
MCH: 30.3 pg (ref 26.0–34.0)
MCHC: 33.7 g/dL (ref 32.0–36.0)
MCV: 89.9 fL (ref 80.0–100.0)
Platelets: 280 10*3/uL (ref 150–440)
RBC: 4.43 MIL/uL (ref 3.80–5.20)
RDW: 13.4 % (ref 11.5–14.5)
WBC: 5.1 10*3/uL (ref 3.6–11.0)

## 2015-11-18 LAB — TYPE AND SCREEN
ABO/RH(D): O POS
ANTIBODY SCREEN: NEGATIVE

## 2015-11-18 NOTE — Patient Instructions (Signed)
  Your procedure is scheduled on: November 28, 2015 (Thursday) Report to Day Surgery.([facility]) SECOND FLOOR To find out your arrival time please call [phone] between 1PM - 3PM on November 27, 2015 (Wednesday).  Remember: Instructions that are not followed completely may result in serious medical risk, up to and including death, or upon the discretion of your surgeon and anesthesiologist your surgery may need to be rescheduled.    _x__ 1. Do not eat food or drink liquids after midnight. No gum chewing or hard candies.     _x___ 2. No Alcohol for 24 hours before or after surgery.   ____ 3. Bring all medications with you on the day of surgery if instructed.    _x___ 4. Notify your doctor if there is any change in your medical condition     (cold, fever, infections).     Do not wear jewelry, make-up, hairpins, clips or nail polish.  Do not wear lotions, powders, or perfumes. You may wear deodorant.  Do not shave 48 hours prior to surgery. Men may shave face and neck.  Do not bring valuables to the hospital.    [facility] is not responsible for any belongings or valuables.               Contacts, dentures or bridgework may not be worn into surgery.  Leave your suitcase in the car. After surgery it may be brought to your room.  For patients admitted to the hospital, discharge time is determined by your                treatment team.   Patients discharged the day of surgery will not be allowed to drive home.   Please read over the following fact sheets that you were given:   Surgical Site Infection Prevention   ____ Take these medicines the morning of surgery with A SIP OF WATER:    1.   2.   3.   4.  5.  6.  ____ Fleet Enema (as directed)   _x___ Use CHG Soap as directed  ____ Use inhalers on the day of surgery  ____ Stop metformin 2 days prior to surgery    ____ Take 1/2 of usual insulin dose the night before surgery and none on the morning of surgery.   __x__ Stop  Coumadin/Plavix/aspirin on (N/A)  __x__ Stop Anti-inflammatories on (NO NSAIDS) Stop Ibuprofen one week prior to surgery, Tylenol ok to take for pain if needed   ____ Stop supplements until after surgery.    ____ Bring C-Pap to the hospital.

## 2015-11-28 ENCOUNTER — Encounter: Payer: Self-pay | Admitting: *Deleted

## 2015-11-28 ENCOUNTER — Ambulatory Visit
Admission: RE | Admit: 2015-11-28 | Discharge: 2015-11-28 | Disposition: A | Discharge status: Home / Self Care | Payer: BLUE CROSS/BLUE SHIELD | Source: Ambulatory Visit | Attending: Obstetrics & Gynecology | Admitting: Obstetrics & Gynecology

## 2015-11-28 ENCOUNTER — Ambulatory Visit: Payer: BLUE CROSS/BLUE SHIELD | Admitting: Anesthesiology

## 2015-11-28 ENCOUNTER — Encounter
Admission: RE | Disposition: A | Discharge status: Home / Self Care | Payer: Self-pay | Source: Ambulatory Visit | Attending: Obstetrics & Gynecology

## 2015-11-28 DIAGNOSIS — Z880 Allergy status to penicillin: Secondary | ICD-10-CM | POA: Insufficient documentation

## 2015-11-28 DIAGNOSIS — Z885 Allergy status to narcotic agent status: Secondary | ICD-10-CM | POA: Diagnosis not present

## 2015-11-28 DIAGNOSIS — Z1502 Genetic susceptibility to malignant neoplasm of ovary: Secondary | ICD-10-CM | POA: Diagnosis not present

## 2015-11-28 DIAGNOSIS — Z1501 Genetic susceptibility to malignant neoplasm of breast: Secondary | ICD-10-CM | POA: Insufficient documentation

## 2015-11-28 DIAGNOSIS — Z808 Family history of malignant neoplasm of other organs or systems: Secondary | ICD-10-CM | POA: Insufficient documentation

## 2015-11-28 DIAGNOSIS — Z882 Allergy status to sulfonamides status: Secondary | ICD-10-CM | POA: Insufficient documentation

## 2015-11-28 DIAGNOSIS — N72 Inflammatory disease of cervix uteri: Secondary | ICD-10-CM | POA: Insufficient documentation

## 2015-11-28 DIAGNOSIS — Z800 Family history of malignant neoplasm of digestive organs: Secondary | ICD-10-CM | POA: Diagnosis not present

## 2015-11-28 DIAGNOSIS — Z833 Family history of diabetes mellitus: Secondary | ICD-10-CM | POA: Diagnosis not present

## 2015-11-28 DIAGNOSIS — N920 Excessive and frequent menstruation with regular cycle: Secondary | ICD-10-CM | POA: Insufficient documentation

## 2015-11-28 DIAGNOSIS — Z881 Allergy status to other antibiotic agents status: Secondary | ICD-10-CM | POA: Insufficient documentation

## 2015-11-28 DIAGNOSIS — Z8349 Family history of other endocrine, nutritional and metabolic diseases: Secondary | ICD-10-CM | POA: Insufficient documentation

## 2015-11-28 DIAGNOSIS — Z803 Family history of malignant neoplasm of breast: Secondary | ICD-10-CM | POA: Diagnosis not present

## 2015-11-28 DIAGNOSIS — N8302 Follicular cyst of left ovary: Secondary | ICD-10-CM | POA: Diagnosis not present

## 2015-11-28 DIAGNOSIS — Z8041 Family history of malignant neoplasm of ovary: Secondary | ICD-10-CM

## 2015-11-28 DIAGNOSIS — N7091 Salpingitis, unspecified: Secondary | ICD-10-CM | POA: Diagnosis not present

## 2015-11-28 DIAGNOSIS — Z9889 Other specified postprocedural states: Secondary | ICD-10-CM | POA: Insufficient documentation

## 2015-11-28 DIAGNOSIS — N8301 Follicular cyst of right ovary: Secondary | ICD-10-CM | POA: Diagnosis not present

## 2015-11-28 DIAGNOSIS — N8500 Endometrial hyperplasia, unspecified: Secondary | ICD-10-CM | POA: Insufficient documentation

## 2015-11-28 DIAGNOSIS — Z15060 Genetic susceptibility to colorectal cancer: Secondary | ICD-10-CM | POA: Diagnosis present

## 2015-11-28 DIAGNOSIS — Z1509 Genetic susceptibility to other malignant neoplasm: Secondary | ICD-10-CM

## 2015-11-28 HISTORY — PX: LAPAROSCOPIC HYSTERECTOMY: SHX1926

## 2015-11-28 LAB — POCT PREGNANCY, URINE: Preg Test, Ur: NEGATIVE

## 2015-11-28 SURGERY — HYSTERECTOMY, TOTAL, LAPAROSCOPIC
Anesthesia: General | Laterality: Bilateral

## 2015-11-28 MED ORDER — CLINDAMYCIN PHOSPHATE 900 MG/50ML IV SOLN
INTRAVENOUS | Status: AC
  Administered 2015-11-28: 900 mg via INTRAVENOUS
  Filled 2015-11-28: qty 50

## 2015-11-28 MED ORDER — DEXAMETHASONE SODIUM PHOSPHATE 10 MG/ML IJ SOLN
INTRAMUSCULAR | Status: DC | PRN
  Administered 2015-11-28: 10 mg via INTRAVENOUS

## 2015-11-28 MED ORDER — FAMOTIDINE 20 MG PO TABS
ORAL_TABLET | ORAL | Status: AC
  Administered 2015-11-28: 20 mg via ORAL
  Filled 2015-11-28: qty 1

## 2015-11-28 MED ORDER — ONDANSETRON HCL 4 MG/2ML IJ SOLN
INTRAMUSCULAR | Status: DC | PRN
  Administered 2015-11-28: 4 mg via INTRAVENOUS

## 2015-11-28 MED ORDER — SUGAMMADEX SODIUM 200 MG/2ML IV SOLN
INTRAVENOUS | Status: DC | PRN
  Administered 2015-11-28: 140 mg via INTRAVENOUS

## 2015-11-28 MED ORDER — ROCURONIUM BROMIDE 100 MG/10ML IV SOLN
INTRAVENOUS | Status: DC | PRN
  Administered 2015-11-28: 40 mg via INTRAVENOUS
  Administered 2015-11-28: 10 mg via INTRAVENOUS

## 2015-11-28 MED ORDER — FENTANYL CITRATE (PF) 100 MCG/2ML IJ SOLN
25.0000 ug | INTRAMUSCULAR | Status: AC | PRN
  Administered 2015-11-28 (×6): 25 ug via INTRAVENOUS

## 2015-11-28 MED ORDER — FAMOTIDINE 20 MG PO TABS
20.0000 mg | ORAL_TABLET | Freq: Once | ORAL | Status: AC
  Administered 2015-11-28: 20 mg via ORAL

## 2015-11-28 MED ORDER — FENTANYL CITRATE (PF) 100 MCG/2ML IJ SOLN
INTRAMUSCULAR | Status: AC
  Filled 2015-11-28: qty 2

## 2015-11-28 MED ORDER — BUPIVACAINE HCL (PF) 0.5 % IJ SOLN
INTRAMUSCULAR | Status: AC
  Filled 2015-11-28: qty 30

## 2015-11-28 MED ORDER — OXYCODONE-ACETAMINOPHEN 5-325 MG PO TABS: 1.0000 | ORAL_TABLET | ORAL | Status: DC | PRN

## 2015-11-28 MED ORDER — LIDOCAINE HCL (CARDIAC) 20 MG/ML IV SOLN
INTRAVENOUS | Status: DC | PRN
  Administered 2015-11-28: 60 mg via INTRAVENOUS

## 2015-11-28 MED ORDER — PHENYLEPHRINE HCL 10 MG/ML IJ SOLN
INTRAMUSCULAR | Status: DC | PRN
  Administered 2015-11-28 (×2): 100 ug via INTRAVENOUS

## 2015-11-28 MED ORDER — FENTANYL CITRATE (PF) 100 MCG/2ML IJ SOLN
INTRAMUSCULAR | Status: DC | PRN
  Administered 2015-11-28: 25 ug via INTRAVENOUS
  Administered 2015-11-28: 100 ug via INTRAVENOUS
  Administered 2015-11-28: 50 ug via INTRAVENOUS

## 2015-11-28 MED ORDER — BUPIVACAINE HCL (PF) 0.5 % IJ SOLN
INTRAMUSCULAR | Status: DC | PRN
  Administered 2015-11-28: 10 mL

## 2015-11-28 MED ORDER — KETOROLAC TROMETHAMINE 30 MG/ML IJ SOLN
INTRAMUSCULAR | Status: DC | PRN
  Administered 2015-11-28: 30 mg via INTRAVENOUS

## 2015-11-28 MED ORDER — LACTATED RINGERS IV SOLN
INTRAVENOUS | Status: DC
  Administered 2015-11-28 (×2): via INTRAVENOUS

## 2015-11-28 MED ORDER — PROPOFOL 10 MG/ML IV BOLUS
INTRAVENOUS | Status: DC | PRN
  Administered 2015-11-28: 150 mg via INTRAVENOUS
  Administered 2015-11-28: 20 mg via INTRAVENOUS

## 2015-11-28 MED ORDER — MIDAZOLAM HCL 2 MG/2ML IJ SOLN
INTRAMUSCULAR | Status: DC | PRN
  Administered 2015-11-28: 2 mg via INTRAVENOUS

## 2015-11-28 MED ORDER — ONDANSETRON HCL 4 MG/2ML IJ SOLN: 4.0000 mg | Freq: Once | INTRAMUSCULAR | Status: DC | PRN

## 2015-11-28 MED ORDER — SUCCINYLCHOLINE CHLORIDE 20 MG/ML IJ SOLN
INTRAMUSCULAR | Status: DC | PRN
  Administered 2015-11-28: 100 mg via INTRAVENOUS

## 2015-11-28 MED ORDER — CLINDAMYCIN PHOSPHATE 900 MG/50ML IV SOLN
900.0000 mg | Freq: Once | INTRAVENOUS | Status: AC
  Administered 2015-11-28: 900 mg via INTRAVENOUS

## 2015-11-28 SURGICAL SUPPLY — 50 items
BAG URO DRAIN 2000ML W/SPOUT (MISCELLANEOUS) ×3 IMPLANT
BLADE SURG SZ11 CARB STEEL (BLADE) ×3 IMPLANT
CANISTER SUCT 1200ML W/VALVE (MISCELLANEOUS) ×3 IMPLANT
CATH FOLEY 2WAY  5CC 16FR (CATHETERS) ×2
CATH URTH 16FR FL 2W BLN LF (CATHETERS) ×1 IMPLANT
CHLORAPREP W/TINT 26ML (MISCELLANEOUS) ×3 IMPLANT
DEFOGGER SCOPE WARMER CLEARIFY (MISCELLANEOUS) ×3 IMPLANT
DERMABOND ADVANCED (GAUZE/BANDAGES/DRESSINGS) ×2
DERMABOND ADVANCED .7 DNX12 (GAUZE/BANDAGES/DRESSINGS) ×1 IMPLANT
DEVICE SUTURE ENDOST 10MM (ENDOMECHANICALS) ×3 IMPLANT
DRAPE CAMERA CLOSED 9X96 (DRAPES) ×3 IMPLANT
DRSG TEGADERM 2-3/8X2-3/4 SM (GAUZE/BANDAGES/DRESSINGS) IMPLANT
DRSG TEGADERM 2X2.25 PEDS (GAUZE/BANDAGES/DRESSINGS) ×3 IMPLANT
ENDOSTITCH 0 SINGLE 48 (SUTURE) ×15 IMPLANT
GAUZE SPONGE NON-WVN 2X2 STRL (MISCELLANEOUS) IMPLANT
GLOVE BIO SURGEON STRL SZ8 (GLOVE) ×15 IMPLANT
GLOVE INDICATOR 8.0 STRL GRN (GLOVE) ×15 IMPLANT
GOWN STRL REUS W/ TWL LRG LVL3 (GOWN DISPOSABLE) ×1 IMPLANT
GOWN STRL REUS W/ TWL XL LVL3 (GOWN DISPOSABLE) ×2 IMPLANT
GOWN STRL REUS W/TWL LRG LVL3 (GOWN DISPOSABLE) ×2
GOWN STRL REUS W/TWL XL LVL3 (GOWN DISPOSABLE) ×4
IRRIGATION STRYKERFLOW (MISCELLANEOUS) ×1 IMPLANT
IRRIGATOR STRYKERFLOW (MISCELLANEOUS) ×3
IV LACTATED RINGERS 1000ML (IV SOLUTION) ×3 IMPLANT
KIT PINK PAD W/HEAD ARE REST (MISCELLANEOUS) ×3
KIT PINK PAD W/HEAD ARM REST (MISCELLANEOUS) ×1 IMPLANT
KIT RM TURNOVER CYSTO AR (KITS) ×3 IMPLANT
LABEL OR SOLS (LABEL) ×3 IMPLANT
LIQUID BAND (GAUZE/BANDAGES/DRESSINGS) ×3 IMPLANT
MANIPULATOR VCARE LG CRV RETR (MISCELLANEOUS) IMPLANT
MANIPULATOR VCARE SML CRV RETR (MISCELLANEOUS) IMPLANT
MANIPULATOR VCARE STD CRV RETR (MISCELLANEOUS) ×3 IMPLANT
NEEDLE VERESS 14GA 120MM (NEEDLE) ×3 IMPLANT
NS IRRIG 500ML POUR BTL (IV SOLUTION) ×3 IMPLANT
OCCLUDER COLPOPNEUMO (BALLOONS) ×3 IMPLANT
PACK GYN LAPAROSCOPIC (MISCELLANEOUS) ×3 IMPLANT
PAD OB MATERNITY 4.3X12.25 (PERSONAL CARE ITEMS) ×3 IMPLANT
PAD PREP 24X41 OB/GYN DISP (PERSONAL CARE ITEMS) ×3 IMPLANT
SCISSORS METZENBAUM CVD 33 (INSTRUMENTS) ×3 IMPLANT
SET CYSTO W/LG BORE CLAMP LF (SET/KITS/TRAYS/PACK) ×3 IMPLANT
SHEARS HARMONIC ACE PLUS 36CM (ENDOMECHANICALS) ×3 IMPLANT
SLEEVE ENDOPATH XCEL 5M (ENDOMECHANICALS) ×3 IMPLANT
SPONGE LAP 18X18 5 PK (GAUZE/BANDAGES/DRESSINGS) ×3 IMPLANT
SPONGE VERSALON 2X2 STRL (MISCELLANEOUS)
SUT VIC AB 2-0 UR6 27 (SUTURE) IMPLANT
SYR 50ML LL SCALE MARK (SYRINGE) ×3 IMPLANT
SYRINGE 10CC LL (SYRINGE) ×3 IMPLANT
TROCAR ENDO BLADELESS 11MM (ENDOMECHANICALS) ×3 IMPLANT
TROCAR XCEL NON-BLD 5MMX100MML (ENDOMECHANICALS) ×3 IMPLANT
TUBING INSUFFLATOR HEATED (MISCELLANEOUS) ×3 IMPLANT

## 2015-11-28 NOTE — Transfer of Care (Signed)
Immediate Anesthesia Transfer of Care Note  Patient: Michelle Cervantes  Procedure(s) Performed: Procedure(s): HYSTERECTOMY TOTAL LAPAROSCOPIC/BSO (Bilateral)  Patient Location: PACU  Anesthesia Type:General  Level of Consciousness: awake, alert  and sedated  Airway & Oxygen Therapy: Patient Spontanous Breathing and Patient connected to nasal cannula oxygen  Post-op Assessment: Report given to RN and Post -op Vital signs reviewed and stable  Post vital signs: Reviewed and stable  Last Vitals:  Filed Vitals:   11/28/15 0833 11/28/15 1153  BP: 109/74 111/85  Pulse: 90 73  Temp: 36.7 C 36.4 C  Resp: 16 14    Complications: No apparent anesthesia complications

## 2015-11-28 NOTE — Op Note (Signed)
Operative Report:  PRE-OP DIAGNOSIS: BRCA CARRIER,RISK FOR BREAST CANCER AND OVARIAN CANCER   POST-OP DIAGNOSIS: BRCA CARRIER,RISK FOR BREAST CANCER AND OVARIAN CANCER   PROCEDURE: Procedure(s): HYSTERECTOMY TOTAL LAPAROSCOPIC BILATERAL SALPINGO-OOPHORECTOMY CYSTOSCOPY  SURGEON: Barnett Applebaum, MD, FACOG  ASSISTANT: Dr Ilda Basset   ANESTHESIA: General endotracheal anesthesia  ESTIMATED BLOOD LOSS: Minimal  SPECIMENS: Uterus, Tubes, Ovaries.  COMPLICATIONS: None  DISPOSITION: stable to PACU  FINDINGS: Intraabdominal adhesions were not noted. Normal appearing ovaries, tubes, uterus, appendix, Liver, colon.  PROCEDURE:  The patient was taken to the OR where anesthesia was administed. She was prepped and draped in the normal sterile fashion in the dorsal lithotomy position in the [place] stirrups. A time out was performed. A Graves speculum was inserted, the cervix was grasped with a single tooth tenaculum and the endometrial cavity was sounded. The cervix was progressively dilated to a size 18 [place] with [employer] dilators. A V-Care uterine manipulator was inserted in the usual fashion without incident. Gloves were changed and attention was turned to the abdomen.   An infraumbilical transverse 54m skin incision was made with the scalpel after local anesthesia applied to the skin. A Veress-step needle was inserted in the usual fashion and confirmed using the hanging drop technique. A pneumoperitoneum was obtained by insufflation of CO2 (opening pressure of 476mg) to 1578m. A diagnostic laparoscopy was performed yielding the previously described findings. Attention was turned to the left lower quadrant where after visualization of the inferior epigastric vessels a 5mm62min incision was made with the scalpel. A 5 mm laparoscopic port was inserted. The same procedure was repeated in the right lower quadrant with a 11mm38mcar. Attention was turned to the left aspect of the uterus, where after  visualization of the ureter, the round ligament was coagulated and transected using the 5mm H23monic Scapel. The anterior and posterior leafs of the broad ligament were dissected off as the anterior one was coagulated and transected in a caudal direction towards the cuff of the uterine manipulator.  Attention was then turned to the left fallopian tube and ovary which was recognized by visualization of the fimbria. The infundibulopelvic ligament and its blood vessels were carefully coagulated and transected using the Harmonic scapel.  Attention was turned to the right aspect of the uterus where the same procedure was performed.  The vesicouterine reflection of the peritoneum was dissected with the harmonic scapel and the bladder flap was created bluntly.  The uterine vessels were coagulated and transected bilaterally using first bipolar cautery and then the harmonic scapel. A 360 degree, circumferential colpotomy was done to completely amputate the uterus with cervix and tubes. Once the specimen was amputated it was delivered through the vagina.   The colpotomy was repaired in a simple interrupted ashion using a 0-Polysorb suture with an endo-stitch device.  Vaginal exam confirms complete closure.  The cavity was copiously irrigated. A survey of the pelvic cavity revealed adequate hemostasis and no injury to bowel, bladder, or ureter.   A diagnostic cystoscopy was performed using saline distension of bladder with no lesions or injuries noted.  Bilateral urine flow from each ureteral orifice is visualized.  At this point the procedure was finalized. All the instruments were removed from the patient's body. Gas was expelled and patient is leveled.  Incisions are closed with skin adhesive.    Patient goes to recovery room in stable condition.  All sponge, instrument, and needle counts are correct x2.

## 2015-11-28 NOTE — Anesthesia Procedure Notes (Signed)
Procedure Name: Intubation Date/Time: 11/28/2015 10:15 AM Performed by: Nelda Marseille Pre-anesthesia Checklist: Patient identified, Patient being monitored, Timeout performed, Emergency Drugs available and Suction available Patient Re-evaluated:Patient Re-evaluated prior to inductionOxygen Delivery Method: Circle system utilized Preoxygenation: Pre-oxygenation with 100% oxygen Intubation Type: IV induction Ventilation: Mask ventilation without difficulty Laryngoscope Size: Mac and 3 Grade View: Grade I Tube type: Oral Tube size: 7.0 mm Number of attempts: 1 Airway Equipment and Method: Stylet Placement Confirmation: ETT inserted through vocal cords under direct vision,  positive ETCO2 and breath sounds checked- equal and bilateral Secured at: 21 cm Tube secured with: Tape Dental Injury: Teeth and Oropharynx as per pre-operative assessment

## 2015-11-28 NOTE — Transfer of Care (Signed)
Immediate Anesthesia Transfer of Care Note  Patient: Michelle Cervantes  Procedure(s) Performed: Procedure(s): HYSTERECTOMY TOTAL LAPAROSCOPIC/BSO (Bilateral)  Patient Location: PACU  Anesthesia Type:General  Level of Consciousness: awake, alert  and oriented  Airway & Oxygen Therapy: Patient Spontanous Breathing and Patient connected to nasal cannula oxygen  Post-op Assessment: Report given to RN and Post -op Vital signs reviewed and stable  Post vital signs: Reviewed and stable  Last Vitals:  Filed Vitals:   11/28/15 0833  BP: 109/74  Pulse: 90  Temp: 36.7 C  Resp: 16    Complications: No apparent anesthesia complications

## 2015-11-28 NOTE — Anesthesia Postprocedure Evaluation (Signed)
Anesthesia Post Note  Patient: Michelle Cervantes  Procedure(s) Performed: Procedure(s) (LRB): HYSTERECTOMY TOTAL LAPAROSCOPIC/BSO (Bilateral)  Patient location during evaluation: PACU Anesthesia Type: General Level of consciousness: awake and alert Pain management: pain level controlled Vital Signs Assessment: post-procedure vital signs reviewed and stable Respiratory status: spontaneous breathing, nonlabored ventilation, respiratory function stable and patient connected to nasal cannula oxygen Cardiovascular status: blood pressure returned to baseline and stable Postop Assessment: no signs of nausea or vomiting Anesthetic complications: no    Last Vitals:  Filed Vitals:   11/28/15 1302 11/28/15 1350  BP: 100/71 100/75  Pulse: 73 71  Temp: 36.4 C   Resp: 18 18    Last Pain:  Filed Vitals:   11/28/15 1352  PainSc: [facility]

## 2015-11-28 NOTE — Discharge Instructions (Signed)
Total Laparoscopic Hysterectomy, Care After Refer to this sheet in the next few weeks. These instructions provide you with information on caring for yourself after your procedure. Your health care provider may also give you more specific instructions. Your treatment has been planned according to current medical practices, but problems sometimes occur. Call your health care provider if you have any problems or questions after your procedure. WHAT TO EXPECT AFTER THE PROCEDURE  Pain and bruising at the incision sites. You will be given pain medicine to control it.  Menopausal symptoms such as hot flashes, night sweats, and insomnia if your ovaries were removed.  Sore throat from the breathing tube that was inserted during surgery. HOME CARE INSTRUCTIONS  Only take over-the-counter or prescription medicines for pain, discomfort, or fever as directed by your health care provider.   Do not take aspirin. It can cause bleeding.   Do not drive when taking pain medicine.   Follow your health care provider's advice regarding diet, exercise, lifting, driving, and general activities.   Resume your usual diet as directed and allowed.   Get plenty of rest and sleep.   Do not douche, use tampons, or have sexual intercourse for at least 6 weeks, or until your health care provider gives you permission.   Change your bandages (dressings) as directed by your health care provider. REMOVE TOMORROW.  Monitor your temperature and notify your health care provider of a fever.   Take showers instead of baths for 2-3 weeks.   Do not drink alcohol until your health care provider gives you permission.   If you develop constipation, you may take a mild laxative with your health care provider's permission. Bran foods may help with constipation problems. Drinking enough fluids to keep your urine clear or pale yellow may help as well.   Try to have someone home with you for 1-2 weeks to help around the  house.   Keep all of your follow-up appointments as directed by your health care provider.  SEEK MEDICAL CARE IF:  You have swelling, redness, or increasing pain around your incision sites.   You have pus coming from your incision.   You notice a bad smell coming from your incision.   Your incision breaks open.   You feel dizzy or lightheaded.   You have pain or bleeding when you urinate.   You have persistent diarrhea.   You have persistent nausea and vomiting.   You have abnormal vaginal discharge.   You have a rash.   You have any type of abnormal reaction or develop an allergy to your medicine.   You have poor pain control with your prescribed medicine.  SEEK IMMEDIATE MEDICAL CARE IF:  You have chest pain or shortness of breath.  You have severe abdominal pain that is not relieved with pain medicine.  You have pain or swelling in your legs. MAKE SURE YOU:  Understand these instructions.  Will watch your condition.  Will get help right away if you are not doing well or get worse.   This information is not intended to replace advice given to you by your health care provider. Make sure you discuss any questions you have with your health care provider.   Document Released: 05/17/2013 Document Revised: 08/01/2013 Document Reviewed: 05/17/2013 Elsevier Interactive Patient Education 2016 [employer]   1) The drugs that you were given will stay in your system until tomorrow so for the next 24 hours  you should not:  A) Drive an automobile B) Make any legal decisions C) Drink any alcoholic beverage   2) You may resume regular meals tomorrow.  Today it is better to start with liquids and gradually work up to solid foods.  You may eat anything you prefer, but it is better to start with liquids, then soup and crackers, and gradually work up to solid foods.   3) Please notify your doctor  immediately if you have any unusual bleeding, trouble breathing, redness and pain at the surgery site, drainage, fever, or pain not relieved by medication.    4) Additional Instructions:        Please contact your physician with any problems or Same Day Surgery at [phone], Monday through Friday 6 am to 4 pm, or [place] [facility] number at [phone].

## 2015-11-28 NOTE — Anesthesia Preprocedure Evaluation (Addendum)
Anesthesia Evaluation  Patient identified by MRN, date of birth, ID band Patient awake    Reviewed: Allergy & Precautions, NPO status , Patient's Chart, lab work & pertinent test results, reviewed documented beta blocker date and time   Airway Mallampati: II  TM Distance: >3 FB     Dental  (+) Chipped   Pulmonary           Cardiovascular      Neuro/Psych  Headaches, Anxiety    GI/Hepatic   Endo/Other    Renal/GU      Musculoskeletal   Abdominal   Peds  Hematology  (+) anemia ,   Anesthesia Other Findings Sounds like she has sinusitis/allergies, with drainage. No fever - will proceed.  Reproductive/Obstetrics                            Anesthesia Physical Anesthesia Plan  ASA: II  Anesthesia Plan: General   Post-op Pain Management:    Induction: Intravenous  Airway Management Planned: Oral ETT  Additional Equipment:   Intra-op Plan:   Post-operative Plan:   Informed Consent: I have reviewed the patients History and Physical, chart, labs and discussed the procedure including the risks, benefits and alternatives for the proposed anesthesia with the patient or authorized representative who has indicated his/her understanding and acceptance.     Plan Discussed with: CRNA  Anesthesia Plan Comments:         Anesthesia Quick Evaluation

## 2015-11-28 NOTE — H&P (Signed)
History and Physical Interval Note:  11/28/2015 9:17 AM  Michelle Cervantes  has presented today for surgery, with the diagnosis of BRCA CARRIER,RISK FOR BREAST CANCER AND OVARIAN CANCER  The various methods of treatment have been discussed with the patient and family. After consideration of risks, benefits and other options for treatment, the patient has consented to  Procedure(s): HYSTERECTOMY TOTAL LAPAROSCOPIC/BSO (Bilateral) as a surgical intervention .  The patient's history has been reviewed, patient examined, no change in status, stable for surgery.  Pt has the following beta blocker history-  Not taking Beta Blocker.  I have reviewed the patient's chart and labs.  Questions were answered to the patient's satisfaction.    Hoyt Koch

## 2015-12-01 LAB — SURGICAL PATHOLOGY

## 2015-12-09 ENCOUNTER — Encounter: Payer: Self-pay | Admitting: *Deleted

## 2015-12-11 ENCOUNTER — Other Ambulatory Visit: Payer: Self-pay | Admitting: Obstetrics & Gynecology

## 2015-12-11 DIAGNOSIS — Z1509 Genetic susceptibility to other malignant neoplasm: Principal | ICD-10-CM

## 2015-12-11 DIAGNOSIS — Z1501 Genetic susceptibility to malignant neoplasm of breast: Secondary | ICD-10-CM

## 2015-12-19 ENCOUNTER — Ambulatory Visit (INDEPENDENT_AMBULATORY_CARE_PROVIDER_SITE_OTHER): Payer: BLUE CROSS/BLUE SHIELD | Admitting: General Surgery

## 2015-12-19 ENCOUNTER — Encounter: Payer: Self-pay | Admitting: General Surgery

## 2015-12-19 DIAGNOSIS — Z800 Family history of malignant neoplasm of digestive organs: Secondary | ICD-10-CM | POA: Diagnosis not present

## 2015-12-19 DIAGNOSIS — Z1589 Genetic susceptibility to other disease: Secondary | ICD-10-CM | POA: Diagnosis not present

## 2015-12-19 DIAGNOSIS — Z1509 Genetic susceptibility to other malignant neoplasm: Secondary | ICD-10-CM

## 2015-12-19 DIAGNOSIS — Z1501 Genetic susceptibility to malignant neoplasm of breast: Secondary | ICD-10-CM

## 2015-12-19 MED ORDER — POLYETHYLENE GLYCOL 3350 17 GM/SCOOP PO POWD: ORAL | Status: DC

## 2015-12-19 NOTE — Patient Instructions (Addendum)
Colonoscopy A colonoscopy is an exam to look at the entire large intestine (colon). This exam can help find problems such as tumors, polyps, inflammation, and areas of bleeding. The exam takes about 1 hour.  LET [facility] CARE PROVIDER KNOW ABOUT:   Any allergies you have.  All medicines you are taking, including vitamins, herbs, eye drops, creams, and over-the-counter medicines.  Previous problems you or members of your family have had with the use of anesthetics.  Any blood disorders you have.  Previous surgeries you have had.  Medical conditions you have. RISKS AND COMPLICATIONS  Generally, this is a safe procedure. However, as with any procedure, complications can occur. Possible complications include:  Bleeding.  Tearing or rupture of the colon wall.  Reaction to medicines given during the exam.  Infection (rare). BEFORE THE PROCEDURE   Ask your health care provider about changing or stopping your regular medicines.  You may be prescribed an oral bowel prep. This involves drinking a large amount of medicated liquid, starting the day before your procedure. The liquid will cause you to have multiple loose stools until your stool is almost clear or light green. This cleans out your colon in preparation for the procedure.  Do not eat or drink anything else once you have started the bowel prep, unless your health care provider tells you it is safe to do so.  Arrange for someone to drive you home after the procedure. PROCEDURE   You will be given medicine to help you relax (sedative).  You will lie on your side with your knees bent.  A long, flexible tube with a light and camera on the end (colonoscope) will be inserted through the rectum and into the colon. The camera sends video back to a computer screen as it moves through the colon. The colonoscope also releases carbon dioxide gas to inflate the colon. This helps your health care provider see the area better.  During  the exam, your health care provider may take a small tissue sample (biopsy) to be examined under a microscope if any abnormalities are found.  The exam is finished when the entire colon has been viewed. AFTER THE PROCEDURE   Do not drive for 24 hours after the exam.  You may have a small amount of blood in your stool.  You may pass moderate amounts of gas and have mild abdominal cramping or bloating. This is caused by the gas used to inflate your colon during the exam.  Ask when your test results will be ready and how you will get your results. Make sure you get your test results.   This information is not intended to replace advice given to you by your health care provider. Make sure you discuss any questions you have with your health care provider.   Document Released: 07/24/2000 Document Revised: 05/17/2013 Document Reviewed: 04/03/2013 Elsevier Interactive Patient Education [employer].  Patient has been scheduled for a colonoscopy on 01-07-16 at [facility].

## 2015-12-19 NOTE — Progress Notes (Signed)
Patient ID: Michelle Cervantes, female   DOB: 1981-07-02, 35 y.o.   MRN: [id number]  Chief Complaint  Patient presents with  . Colonoscopy    HPI Michelle Cervantes is a 35 y.o. female here today for a evaluation of a screening colonoscopy. She is BRCA1 gene positive and MUTYH positve. Her father was recently treated for colon cancer. She does occasionally have lower abdomen pain about once a month. Moves bowels regularly. I have reviewed the history of present illness with the patient.  HPI  Past Medical History  Diagnosis Date  . BRCA1 positive        . Anxiety   . Anemia     Past Surgical History  Procedure Laterality Date  . Nose surgery    . Cesarean section N/A 01/19/2015    Procedure: CESAREAN SECTION;  Surgeon: Gae Dry, MD;  Location: [facility] ORS;  Service: Obstetrics;  Laterality: N/A;  . Laparoscopic hysterectomy Bilateral 11/28/2015    Procedure: HYSTERECTOMY TOTAL LAPAROSCOPIC/BSO;  Surgeon: Gae Dry, MD;  Location: [facility] ORS;  Service: Gynecology;  Laterality: Bilateral;    Family History  Problem Relation Age of Onset  . Arthritis Mother   . Heart disease Mother   . Hypertension Mother   . Miscarriages / [place] Mother   . Vision loss Mother   . Alcohol abuse Father   . Arthritis Father   . Cancer Father 12    Colon  . Hyperlipidemia Father   . Vision loss Father   . Varicose Veins Father   . Hyperlipidemia Brother   . Vision loss Brother   . Diabetes Maternal Aunt   . Heart disease Maternal Aunt   . Kidney disease Maternal Aunt   . Cancer Maternal Aunt     Breast  . Diabetes Maternal Uncle   . Cancer Paternal Aunt   . Early death Paternal 10   . Varicose Veins Paternal Aunt   . Breast cancer Paternal Aunt   . Diabetes Maternal Grandmother   . Cancer Maternal Grandfather   . Stroke Paternal Grandmother   . Vision loss Paternal Grandmother   . Varicose Veins Paternal Grandmother   . Alcohol abuse Paternal Grandfather   . Cancer  Paternal Grandfather     Social History Social History  Substance Use Topics  . Smoking status: Never Smoker   . Smokeless tobacco: Never Used  . Alcohol Use: No    Allergies  Allergen Reactions  . Ceclor [Cefaclor] Rash  . Duravent Dm [Phenylephrine-Dm-Gg] Rash  . Macrolides And Ketolides Rash  . Penicillins Rash  . Sulfa Antibiotics Rash    Current Outpatient Prescriptions  Medication Sig Dispense Refill  . ibuprofen (ADVIL,MOTRIN) 600 MG tablet Take 1 tablet (600 mg total) by mouth every 6 (six) hours. 40 tablet 1  . polyethylene glycol powder (GLYCOLAX/MIRALAX) powder 255 grams one bottle for colonoscopy prep 255 g 0   No current facility-administered medications for this visit.    Review of Systems Review of Systems  Constitutional: Negative.   Respiratory: Negative.   Cardiovascular: Negative.   Gastrointestinal: Negative.     Blood pressure 120/72, pulse 68, resp. rate 12, height _0  (1.575 m), weight 148 lb (67.132 kg), not currently breastfeeding.  Physical Exam Physical Exam  Constitutional: She is oriented to person, place, and time. She appears well-developed and well-nourished.  Eyes: Conjunctivae are normal. No scleral icterus.  Neck: Neck supple.  Cardiovascular: Normal rate, regular rhythm and normal heart sounds.   Pulmonary/Chest:  Effort normal and breath sounds normal.  Abdominal: Soft. Bowel sounds are normal. There is no tenderness.  Lymphadenopathy:    She has no cervical adenopathy.  Neurological: She is alert and oriented to person, place, and time.  Skin: Skin is warm and dry.    Data Reviewed Notes and Labs reviewed.  Assessment    BRCA1 and MUTYH positive.  Candidate for screening colonoscopy due to family history of colon cancer and elevated risk based on genetic testing. She has had complete hysterectomy. No decision yet regarding breast cancer risk.    Plan    Colonoscopy with possible biopsy/polypectomy prn: Information  regarding the procedure, including its potential risks and complications (including but not limited to perforation of the bowel, which may require emergency surgery to repair, and bleeding) was verbally given to the patient. Educational information regarding lower intestinal endoscopy was given to the patient. Written instructions for how to complete the bowel prep using Miralax were provided. The importance of drinking ample fluids to avoid dehydration as a result of the prep emphasized.  Discussed with patient that if abdominal pain persists will order a CT after colonoscopy.   Patient has been scheduled for a colonoscopy on 01-07-16 at [facility].       PCP: No PCP  Ref. Dr. Kenton Kingfisher   This information has been scribed by [facility] G 12/23/2015, 7:48 AM

## 2015-12-23 ENCOUNTER — Encounter: Payer: Self-pay | Admitting: General Surgery

## 2015-12-24 ENCOUNTER — Ambulatory Visit (INDEPENDENT_AMBULATORY_CARE_PROVIDER_SITE_OTHER): Payer: BLUE CROSS/BLUE SHIELD | Admitting: Podiatry

## 2015-12-24 ENCOUNTER — Ambulatory Visit (INDEPENDENT_AMBULATORY_CARE_PROVIDER_SITE_OTHER): Payer: BLUE CROSS/BLUE SHIELD

## 2015-12-24 ENCOUNTER — Encounter: Payer: Self-pay | Admitting: Podiatry

## 2015-12-24 VITALS — BP 111/77 | HR 75 | Resp 18

## 2015-12-24 DIAGNOSIS — R52 Pain, unspecified: Secondary | ICD-10-CM | POA: Diagnosis not present

## 2015-12-24 DIAGNOSIS — B078 Other viral warts: Secondary | ICD-10-CM

## 2015-12-24 DIAGNOSIS — B079 Viral wart, unspecified: Secondary | ICD-10-CM

## 2015-12-24 NOTE — Progress Notes (Signed)
   Subjective:    Patient ID: Michelle Cervantes, female    DOB: [birth year], 35 y.o.   MRN: [id number]  HPI  Patient presents today for complaints of a lesion of the back of her left heel which is been ongoing for about 6 months is painful to pressure in shoes. She has tried over-the-counter wart remover without any relief. She is also tried seen in the area down. No redness or drainage. No other complaints. Denies any foreign objects.  Review of Systems  All other systems reviewed and are negative.      Objective:   Physical Exam General: AAO x3, NAD  Dermatological: On the posterior aspect of the left heel the hyperkeratotic lesion. Upon debridement there was pinpoint bleeding and there is evidence of verruca. Tenderness over on this area. No drainage is any redness or swelling. Upon reaming no evidence of foreign body. No open lesions.  Vascular: Dorsalis Pedis artery and Posterior Tibial artery pedal pulses are 2/4 bilateral with immedate capillary fill time. Pedal hair growth present. No varicosities and no lower extremity edema present bilateral. There is no pain with calf compression, swelling, warmth, erythema.   Neruologic: Grossly intact via light touch bilateral. Vibratory intact via tuning fork bilateral. Protective threshold with Semmes Wienstein monofilament intact to all pedal sites bilateral. Patellar and Achilles deep tendon reflexes 2+ bilateral. No Babinski or clonus noted bilateral.   Musculoskeletal: No gross boney pedal deformities bilateral. No pain, crepitus, or limitation noted with foot and ankle range of motion bilateral. Muscular strength 5/5 in all groups tested bilateral.  Gait: Unassisted, Nonantalgic.      Assessment & Plan:  35 year old female left posterior heel likely verruca -Treatment options discussed including all alternatives, risks, and complications -Etiology of symptoms were discussed -X-rays were obtained and reviewed with the patient. No  evidence of foreign body. No acute fracture. -Lesion was debrided. The area was cleaned followed by Cantharone and occlusive bandage. Post procedure instructions discussed. Monitor for any clinical signs or symptoms of infection and directed to call the office immediately should any occur or go to the ER. -Follow-up in 3 weeks or sooner if any problems arise. In the meantime, encouraged to call the office with any questions, concerns, change in symptoms.   Celesta Gentile, DPM

## 2015-12-24 NOTE — Patient Instructions (Signed)
Take dressing off in 8 hours and wash the foot with soap and water. If it is hurting or becomes uncomfortable before the 8 hours, go ahead and remove the bandage and wash the area.  If it blisters, apply antibiotic ointment and a band-aid.  Monitor for any signs/symptoms of infection. Call the office immediately if any occur or go directly to the emergency room. Call with any questions/concerns.   

## 2016-01-07 ENCOUNTER — Encounter
Admission: RE | Disposition: A | Discharge status: Home Health | Payer: Self-pay | Source: Ambulatory Visit | Attending: General Surgery

## 2016-01-07 ENCOUNTER — Encounter: Payer: Self-pay | Admitting: *Deleted

## 2016-01-07 ENCOUNTER — Ambulatory Visit
Admission: RE | Admit: 2016-01-07 | Discharge: 2016-01-07 | Disposition: A | Discharge status: Home Health | Payer: BLUE CROSS/BLUE SHIELD | Source: Ambulatory Visit | Attending: General Surgery | Admitting: General Surgery

## 2016-01-07 ENCOUNTER — Ambulatory Visit: Payer: BLUE CROSS/BLUE SHIELD | Admitting: Anesthesiology

## 2016-01-07 DIAGNOSIS — K5730 Diverticulosis of large intestine without perforation or abscess without bleeding: Secondary | ICD-10-CM | POA: Insufficient documentation

## 2016-01-07 DIAGNOSIS — Z1211 Encounter for screening for malignant neoplasm of colon: Secondary | ICD-10-CM | POA: Diagnosis not present

## 2016-01-07 DIAGNOSIS — Z862 Personal history of diseases of the blood and blood-forming organs and certain disorders involving the immune mechanism: Secondary | ICD-10-CM | POA: Insufficient documentation

## 2016-01-07 DIAGNOSIS — F419 Anxiety disorder, unspecified: Secondary | ICD-10-CM | POA: Diagnosis not present

## 2016-01-07 DIAGNOSIS — D128 Benign neoplasm of rectum: Secondary | ICD-10-CM | POA: Diagnosis not present

## 2016-01-07 DIAGNOSIS — K621 Rectal polyp: Secondary | ICD-10-CM | POA: Diagnosis not present

## 2016-01-07 DIAGNOSIS — Z800 Family history of malignant neoplasm of digestive organs: Secondary | ICD-10-CM | POA: Diagnosis not present

## 2016-01-07 HISTORY — PX: COLONOSCOPY WITH PROPOFOL: SHX5780

## 2016-01-07 SURGERY — COLONOSCOPY WITH PROPOFOL
Anesthesia: General

## 2016-01-07 MED ORDER — PROPOFOL 500 MG/50ML IV EMUL
INTRAVENOUS | Status: DC | PRN
  Administered 2016-01-07: 175 ug/kg/min via INTRAVENOUS

## 2016-01-07 MED ORDER — PROPOFOL 10 MG/ML IV BOLUS
INTRAVENOUS | Status: DC | PRN
  Administered 2016-01-07: 90 mg via INTRAVENOUS

## 2016-01-07 MED ORDER — LIDOCAINE HCL (CARDIAC) 20 MG/ML IV SOLN
INTRAVENOUS | Status: DC | PRN
  Administered 2016-01-07: 60 mg via INTRAVENOUS
  Administered 2016-01-07: 40 mg via INTRAVENOUS

## 2016-01-07 MED ORDER — SODIUM CHLORIDE 0.9 % IV SOLN
INTRAVENOUS | Status: DC
  Administered 2016-01-07: 13:00:00 via INTRAVENOUS

## 2016-01-07 NOTE — H&P (View-Only) (Signed)
Patient ID: Michelle Cervantes, female   DOB: 01/20/1981, 35 y.o.   MRN: [id number]  Chief Complaint  Patient presents with  . Colonoscopy    HPI Michelle Cervantes is a 35 y.o. female here today for a evaluation of a screening colonoscopy. She is BRCA1 gene positive and MUTYH positve. Her father was recently treated for colon cancer. She does occasionally have lower abdomen pain about once a month. Moves bowels regularly. I have reviewed the history of present illness with the patient.  HPI  Past Medical History  Diagnosis Date  . BRCA1 positive        . Anxiety   . Anemia     Past Surgical History  Procedure Laterality Date  . Nose surgery    . Cesarean section N/A 01/19/2015    Procedure: CESAREAN SECTION;  Surgeon: Robert P Harris, MD;  Location: [facility] ORS;  Service: Obstetrics;  Laterality: N/A;  . Laparoscopic hysterectomy Bilateral 11/28/2015    Procedure: HYSTERECTOMY TOTAL LAPAROSCOPIC/BSO;  Surgeon: Robert P Harris, MD;  Location: [facility] ORS;  Service: Gynecology;  Laterality: Bilateral;    Family History  Problem Relation Age of Onset  . Arthritis Mother   . Heart disease Mother   . Hypertension Mother   . Miscarriages / Stillbirths Mother   . Vision loss Mother   . Alcohol abuse Father   . Arthritis Father   . Cancer Father 68    Colon  . Hyperlipidemia Father   . Vision loss Father   . Varicose Veins Father   . Hyperlipidemia Brother   . Vision loss Brother   . Diabetes Maternal Aunt   . Heart disease Maternal Aunt   . Kidney disease Maternal Aunt   . Cancer Maternal Aunt     Breast  . Diabetes Maternal Uncle   . Cancer Paternal Aunt   . Early death Paternal Aunt   . Varicose Veins Paternal Aunt   . Breast cancer Paternal Aunt   . Diabetes Maternal Grandmother   . Cancer Maternal Grandfather   . Stroke Paternal Grandmother   . Vision loss Paternal Grandmother   . Varicose Veins Paternal Grandmother   . Alcohol abuse Paternal Grandfather   . Cancer  Paternal Grandfather     Social History Social History  Substance Use Topics  . Smoking status: Never Smoker   . Smokeless tobacco: Never Used  . Alcohol Use: No    Allergies  Allergen Reactions  . Ceclor [Cefaclor] Rash  . Duravent Dm [Phenylephrine-Dm-Gg] Rash  . Macrolides And Ketolides Rash  . Penicillins Rash  . Sulfa Antibiotics Rash    Current Outpatient Prescriptions  Medication Sig Dispense Refill  . ibuprofen (ADVIL,MOTRIN) 600 MG tablet Take 1 tablet (600 mg total) by mouth every 6 (six) hours. 40 tablet 1  . polyethylene glycol powder (GLYCOLAX/MIRALAX) powder 255 grams one bottle for colonoscopy prep 255 g 0   No current facility-administered medications for this visit.    Review of Systems Review of Systems  Constitutional: Negative.   Respiratory: Negative.   Cardiovascular: Negative.   Gastrointestinal: Negative.     Blood pressure 120/72, pulse 68, resp. rate 12, height 5' 2" (1.575 m), weight 148 lb (67.132 kg), not currently breastfeeding.  Physical Exam Physical Exam  Constitutional: She is oriented to person, place, and time. She appears well-developed and well-nourished.  Eyes: Conjunctivae are normal. No scleral icterus.  Neck: Neck supple.  Cardiovascular: Normal rate, regular rhythm and normal heart sounds.   Pulmonary/Chest:   Effort normal and breath sounds normal.  Abdominal: Soft. Bowel sounds are normal. There is no tenderness.  Lymphadenopathy:    She has no cervical adenopathy.  Neurological: She is alert and oriented to person, place, and time.  Skin: Skin is warm and dry.    Data Reviewed Notes and Labs reviewed.  Assessment    BRCA1 and MUTYH positive.  Candidate for screening colonoscopy due to family history of colon cancer and elevated risk based on genetic testing. She has had complete hysterectomy. No decision yet regarding breast cancer risk.    Plan    Colonoscopy with possible biopsy/polypectomy prn: Information  regarding the procedure, including its potential risks and complications (including but not limited to perforation of the bowel, which may require emergency surgery to repair, and bleeding) was verbally given to the patient. Educational information regarding lower intestinal endoscopy was given to the patient. Written instructions for how to complete the bowel prep using Miralax were provided. The importance of drinking ample fluids to avoid dehydration as a result of the prep emphasized.  Discussed with patient that if abdominal pain persists will order a CT after colonoscopy.   Patient has been scheduled for a colonoscopy on 01-07-16 at [facility].       PCP: No PCP  Ref. Dr. Harris   This information has been scribed by Rebeca Morris,CMA    Kivon Aprea G 12/23/2015, 7:48 AM    

## 2016-01-07 NOTE — Interval H&P Note (Signed)
History and Physical Interval Note:  01/07/2016 2:16 PM  Michelle Cervantes  has presented today for surgery, with the diagnosis of FH COLON CA  The various methods of treatment have been discussed with the patient and family. After consideration of risks, benefits and other options for treatment, the patient has consented to  Procedure(s): COLONOSCOPY WITH PROPOFOL (N/A) as a surgical intervention .  The patient's history has been reviewed, patient examined, no change in status, stable for surgery.  I have reviewed the patient's chart and labs.  Questions were answered to the patient's satisfaction.     Jaquann Guarisco G

## 2016-01-07 NOTE — Anesthesia Preprocedure Evaluation (Signed)
Anesthesia Evaluation  Patient identified by MRN, date of birth, ID band Patient awake    Reviewed: Allergy & Precautions, H&P , NPO status , Patient's Chart, lab work & pertinent test results  History of Anesthesia Complications Negative for: history of anesthetic complications  Airway Mallampati: III  TM Distance: >3 FB Neck ROM: full    Dental  (+) Poor Dentition   Pulmonary neg pulmonary ROS, neg shortness of breath,    Pulmonary exam normal breath sounds clear to auscultation       Cardiovascular Exercise Tolerance: Good (-) angina(-) Past MI and (-) DOE negative cardio ROS Normal cardiovascular exam Rhythm:regular Rate:Normal     Neuro/Psych PSYCHIATRIC DISORDERS Anxiety negative neurological ROS     GI/Hepatic negative GI ROS, Neg liver ROS,   Endo/Other  negative endocrine ROS  Renal/GU negative Renal ROS  negative genitourinary   Musculoskeletal   Abdominal   Peds  Hematology negative hematology ROS (+)   Anesthesia Other Findings Past Medical History:   BRCA1 positive                                                 Comment:    Anxiety                                                      Anemia                                                      Past Surgical History:   NOSE SURGERY                                                    Comment:Sinus surger   CESAREAN SECTION                                N/A 01/19/2015      Comment:Procedure: CESAREAN SECTION;  Surgeon: Gae Dry, MD;  Location: [facility] ORS;  Service:               Obstetrics;  Laterality: N/A;   LAPAROSCOPIC HYSTERECTOMY                       Bilateral 11/28/2015      Comment:Procedure: HYSTERECTOMY TOTAL LAPAROSCOPIC/BSO;              Surgeon: Gae Dry, MD;  Location: [facility]               ORS;  Service: Gynecology;  Laterality:               Bilateral;  BMI    Body Mass Index   26.87 kg/m 2      Reproductive/Obstetrics negative OB ROS  Anesthesia Physical Anesthesia Plan  ASA: II  Anesthesia Plan: General   Post-op Pain Management:    Induction:   Airway Management Planned:   Additional Equipment:   Intra-op Plan:   Post-operative Plan:   Informed Consent: I have reviewed the patients History and Physical, chart, labs and discussed the procedure including the risks, benefits and alternatives for the proposed anesthesia with the patient or authorized representative who has indicated his/her understanding and acceptance.   Dental Advisory Given  Plan Discussed with: Anesthesiologist, CRNA and Surgeon  Anesthesia Plan Comments:         Anesthesia Quick Evaluation

## 2016-01-07 NOTE — Transfer of Care (Signed)
Immediate Anesthesia Transfer of Care Note  Patient: Michelle Cervantes  Procedure(s) Performed: Procedure(s): COLONOSCOPY WITH PROPOFOL (N/A)  Patient Location: PACU  Anesthesia Type:General  Level of Consciousness: awake, alert  and responds to stimulation  Airway & Oxygen Therapy: Patient Spontanous Breathing and Patient connected to nasal cannula oxygen  Post-op Assessment: Report given to RN and Post -op Vital signs reviewed and stable  Post vital signs: Reviewed and stable  Last Vitals:  Filed Vitals:   01/07/16 1314 01/07/16 1500  BP: 107/74 110/76  Pulse: 66   Temp: 36.3 C   Resp: 16 17    Last Pain: There were no vitals filed for this visit.       Complications: No apparent anesthesia complications

## 2016-01-07 NOTE — Op Note (Signed)
[facility] Patient Name: Michelle Cervantes Procedure Date: 01/07/2016 2:17 PM MRN: [id number] Account #: [id number] Date of Birth: 1981/04/24 Admit Type: Outpatient Age: 35 Room: [facility] ENDO ROOM 4 Gender: Female Note Status: Finalized Procedure:            Colonoscopy Indications:          Screening patient at increased risk: Colorectal cancer                        in multiple 1st-degree relatives age 69 years or older Providers:            Seeplaputhur G. Jamal Collin, MD Referring MD:         Barnett Applebaum, MD (Referring MD) Medicines:            General Anesthesia Complications:        No immediate complications. Procedure:            Pre-Anesthesia Assessment:                       - General anesthesia under the supervision of an                        anesthesiologist was determined to be medically                        necessary for this procedure based on review of the                        patient's medical history, medications, and prior                        anesthesia history.                       After obtaining informed consent, the colonoscope was                        passed under direct vision. Throughout the procedure,                        the patient's blood pressure, pulse, and oxygen                        saturations were monitored continuously. The                        Colonoscope was introduced through the anus and                        advanced to the the cecum, identified by the ileocecal                        valve. The colonoscopy was performed without                        difficulty. The patient tolerated the procedure well.                        The quality of the bowel preparation was good. Findings:      The perianal and digital rectal examinations were normal.  A 5 mm polyp was found in the rectum. The polyp was semi-pedunculated.       The polyp was removed with a hot snare. Resection and retrieval were        complete.      A few small-mouthed diverticula were found in the sigmoid colon and       transverse colon.      The exam was otherwise without abnormality on direct and retroflexion       views. Impression:           - One 5 mm polyp in the rectum, removed with a hot                        snare. Resected and retrieved.                       - Diverticulosis in the sigmoid colon and in the                        transverse colon.                       - The examination was otherwise normal on direct and                        retroflexion views. Recommendation:       - Discharge patient to home.                       - Repeat colonoscopy in 3 years for surveillance. Procedure Code(s):    --- Professional ---                       [phone], Colonoscopy, flexible; with removal of tumor(s),                        polyp(s), or other lesion(s) by snare technique Diagnosis Code(s):    --- Professional ---                       Z80.0, Family history of malignant neoplasm of                        digestive organs                       K62.1, Rectal polyp                       K57.30, Diverticulosis of large intestine without                        perforation or abscess without bleeding CPT copyright 2016 American Medical Association. All rights reserved. The codes documented in this report are preliminary and upon coder review may  be revised to meet current compliance requirements. Christene Lye, MD 01/07/2016 3:03:28 PM This report has been signed electronically. Number of Addenda: 0 Note Initiated On: 01/07/2016 2:17 PM Scope Withdrawal Time: 0 hours 6 minutes 57 seconds  Total Procedure Duration: 0 hours 38 minutes 56 seconds       [facility]

## 2016-01-08 ENCOUNTER — Encounter: Payer: Self-pay | Admitting: General Surgery

## 2016-01-09 ENCOUNTER — Ambulatory Visit
Admission: RE | Admit: 2016-01-09 | Discharge: 2016-01-09 | Disposition: A | Discharge status: Home / Self Care | Payer: BLUE CROSS/BLUE SHIELD | Source: Ambulatory Visit | Attending: Obstetrics & Gynecology | Admitting: Obstetrics & Gynecology

## 2016-01-09 DIAGNOSIS — Z1509 Genetic susceptibility to other malignant neoplasm: Principal | ICD-10-CM

## 2016-01-09 DIAGNOSIS — Z1501 Genetic susceptibility to malignant neoplasm of breast: Secondary | ICD-10-CM

## 2016-01-09 LAB — SURGICAL PATHOLOGY

## 2016-01-09 MED ORDER — GADOBENATE DIMEGLUMINE 529 MG/ML IV SOLN
14.0000 mL | Freq: Once | INTRAVENOUS | Status: AC | PRN
  Administered 2016-01-09: 14 mL via INTRAVENOUS

## 2016-01-09 NOTE — Anesthesia Postprocedure Evaluation (Signed)
Anesthesia Post Note  Patient: Michelle Cervantes  Procedure(s) Performed: Procedure(s) (LRB): COLONOSCOPY WITH PROPOFOL (N/A)  Patient location during evaluation: PACU Anesthesia Type: General Level of consciousness: awake Pain management: pain level controlled Vital Signs Assessment: post-procedure vital signs reviewed and stable Respiratory status: spontaneous breathing Cardiovascular status: blood pressure returned to baseline Anesthetic complications: no    Last Vitals:  Filed Vitals:   01/07/16 1521 01/07/16 1531  BP: 112/80 115/82  Pulse: 58 65  Temp:    Resp: 17 14    Last Pain:  Filed Vitals:   01/08/16 0802  PainSc: 0-No pain                 VAN STAVEREN,Zoltan Genest

## 2016-01-10 ENCOUNTER — Other Ambulatory Visit: Payer: Self-pay | Admitting: Obstetrics & Gynecology

## 2016-01-10 DIAGNOSIS — N63 Unspecified lump in unspecified breast: Secondary | ICD-10-CM

## 2016-01-14 ENCOUNTER — Ambulatory Visit
Admission: RE | Admit: 2016-01-14 | Discharge: 2016-01-14 | Disposition: A | Discharge status: Home / Self Care | Payer: BLUE CROSS/BLUE SHIELD | Source: Ambulatory Visit | Attending: Obstetrics & Gynecology | Admitting: Obstetrics & Gynecology

## 2016-01-14 ENCOUNTER — Ambulatory Visit (INDEPENDENT_AMBULATORY_CARE_PROVIDER_SITE_OTHER): Payer: BLUE CROSS/BLUE SHIELD | Admitting: Podiatry

## 2016-01-14 ENCOUNTER — Encounter: Payer: Self-pay | Admitting: Podiatry

## 2016-01-14 DIAGNOSIS — N63 Unspecified lump in unspecified breast: Secondary | ICD-10-CM

## 2016-01-14 DIAGNOSIS — B079 Viral wart, unspecified: Secondary | ICD-10-CM

## 2016-01-14 DIAGNOSIS — B078 Other viral warts: Secondary | ICD-10-CM

## 2016-01-14 MED ORDER — GADOBENATE DIMEGLUMINE 529 MG/ML IV SOLN
13.0000 mL | Freq: Once | INTRAVENOUS | Status: AC | PRN
  Administered 2016-01-14: 13 mL via INTRAVENOUS

## 2016-01-14 NOTE — Patient Instructions (Signed)
Take dressing off in 8 hours and wash the foot with soap and water. If it is hurting or becomes uncomfortable before the 8 hours, go ahead and remove the bandage and wash the area.  If it blisters, apply antibiotic ointment and a band-aid.  Monitor for any signs/symptoms of infection. Call the office immediately if any occur or go directly to the emergency room. Call with any questions/concerns.   

## 2016-01-14 NOTE — Progress Notes (Signed)
Patient ID: Michelle Cervantes, female   DOB: January 10, 1981, 35 y.o.   MRN: [id number]  Subjective: 7 show female presents the office they for follow-up with vibration of a wart to the posterior left heel. She states that is not as painful as what it was and is improving. She was the area needs to cut off at this point as his become more superficial. Denies any, occasions the last treatment. No swelling redness or red streaks. Denies any systemic complaints such as fevers, chills, nausea, vomiting. No acute changes since last appointment, and no other complaints at this time.   Objective: AAO x3, NAD DP/PT pulses palpable bilaterally, CRT less than 3 seconds Hyperkeratotic lesion posterior left heel. Upon debridement there is pinpoint bleeding and evidence of her although it appears to be more superficial and smaller. There is decreased tenderness. No surrounding redness or red streaks. No edema.  No areas of pinpoint bony tenderness or pain with vibratory sensation. MMT 5/5, ROM WNL. No edema, erythema, increase in warmth to bilateral lower extremities.  No open lesions or pre-ulcerative lesions.  No pain with calf compression, swelling, warmth, erythema  Assessment: Resolving left posterior heel verruca  Plan: -All treatment options discussed with the patient including all alternatives, risks, complications.  -Lesion was debrided. Hemostasis was achieved and area was cleaned. Cantharone was applied followed by an occlusive bandage. Post procedure infection discussed the patient. Monitor for infection. Follow-up in 3-4 weeks or sooner if needed. -Patient encouraged to call the office with any questions, concerns, change in symptoms.    Celesta Gentile, DPM

## 2016-02-04 ENCOUNTER — Ambulatory Visit: Payer: BLUE CROSS/BLUE SHIELD | Admitting: Podiatry

## 2016-02-04 ENCOUNTER — Telehealth: Payer: Self-pay | Admitting: Podiatry

## 2016-02-04 NOTE — Telephone Encounter (Signed)
Pt cxled appt for today because wart fell off this morning.

## 2016-02-14 ENCOUNTER — Other Ambulatory Visit: Payer: Self-pay | Admitting: General Surgery

## 2016-02-25 ENCOUNTER — Encounter: Payer: Self-pay | Admitting: Pediatrics

## 2016-03-31 ENCOUNTER — Encounter: Payer: Self-pay | Admitting: Plastic Surgery

## 2016-04-09 ENCOUNTER — Other Ambulatory Visit: Payer: Self-pay | Admitting: General Surgery

## 2016-05-19 ENCOUNTER — Encounter (HOSPITAL_BASED_OUTPATIENT_CLINIC_OR_DEPARTMENT_OTHER): Payer: Self-pay | Admitting: *Deleted

## 2016-05-19 ENCOUNTER — Other Ambulatory Visit: Payer: Self-pay | Admitting: General Surgery

## 2016-05-19 DIAGNOSIS — K120 Recurrent oral aphthae: Secondary | ICD-10-CM

## 2016-05-19 HISTORY — DX: Recurrent oral aphthae: K12.0

## 2016-05-19 NOTE — Pre-Procedure Instructions (Signed)
To pick up Boost Breeze 8 oz.

## 2016-05-20 NOTE — H&P (Signed)
Subjective:    Patient ID: Michelle Cervantes is a 35 y.o. female.  HPI  Here for follow up discussion breast reconstruction. Diagnosed with BRCA1 and MUTYH 2015, week prior to fining out she was pregnant with twins. Genetic testing done at [facility] via [employer]. Father with polyposis, paternal relatives with breast ca premenopausal. Underwent TAH/BSO and colonoscopy this year. Last MMG 11/2015 benign, last screening MRI 01/2016 with NME within the LEFT breast, 9 o'clock, 9 x 5 x 6 mm. Biopsy with benign tissue, felt to be discordant, and surgical excision recommended.   Current B cup, satisfied with this.  Wt- increased post hysterectomy. Highest wt 180 with pregnancy, prior to pregnancy 135 lb.  Has 4 children, twins age 23. 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  Review of Systems     Objective:   Physical Exam  Cardiovascular: Normal rate, regular rhythm and normal heart sounds.   Pulmonary/Chest: Effort normal.  Abdominal:  No panniculus  abdominal port scars red, indurated  R>L volume, Right Grade 2 ptosis, Left grade 1   SN to nipple R 17 L 16 cm BW R 17 L 16 cm Nipple to IMF R 8.5 L 7.5  Assessment:     BRCA1, MUTYH mutations    Plan:     Plan bilateral NSM with TE, possible acellular dermis.Reviewed process of expansion and implant based risks including rupture, MRI surveillance for silicone implants, infection requiring surgery or removal, contracture. Discussed future surgery for placement permanent implants.  Reviewed  NSM will be asensate and not stimulate. Reviewed risks mastectomy flap necrosis requiring additional surgery.  Discussed use of acellular dermis in reconstruction, cadaveric source. Reviewed can be source of seroma or infection if it does not incorporate.  Reviewed additional risks including but not limited to bleeding, hematoma, asymmetry, damage to deeper structures, DVT/PE, cardiopulmonary complications, seroma,  unacceptable cosmetic result. Reviewed post procedure limitations, medications. Provided Rx for oxycodone, robaxin and cipro.  Irene Limbo, MD [facility] Plastic & Reconstructive Surgery [phone], pin [phone]

## 2016-05-22 ENCOUNTER — Ambulatory Visit (HOSPITAL_BASED_OUTPATIENT_CLINIC_OR_DEPARTMENT_OTHER)
Admission: RE | Admit: 2016-05-22 | Discharge: 2016-05-23 | Disposition: A | Discharge status: Home / Self Care | Payer: BLUE CROSS/BLUE SHIELD | Source: Ambulatory Visit | Attending: Plastic Surgery | Admitting: Plastic Surgery

## 2016-05-22 ENCOUNTER — Encounter (HOSPITAL_BASED_OUTPATIENT_CLINIC_OR_DEPARTMENT_OTHER): Payer: Self-pay | Admitting: Certified Registered"

## 2016-05-22 ENCOUNTER — Ambulatory Visit (HOSPITAL_BASED_OUTPATIENT_CLINIC_OR_DEPARTMENT_OTHER): Payer: BLUE CROSS/BLUE SHIELD | Admitting: Certified Registered"

## 2016-05-22 ENCOUNTER — Encounter (HOSPITAL_BASED_OUTPATIENT_CLINIC_OR_DEPARTMENT_OTHER)
Admission: RE | Disposition: A | Discharge status: Home / Self Care | Payer: Self-pay | Source: Ambulatory Visit | Attending: Plastic Surgery

## 2016-05-22 DIAGNOSIS — Z79899 Other long term (current) drug therapy: Secondary | ICD-10-CM | POA: Insufficient documentation

## 2016-05-22 DIAGNOSIS — Z90710 Acquired absence of both cervix and uterus: Secondary | ICD-10-CM | POA: Insufficient documentation

## 2016-05-22 DIAGNOSIS — F419 Anxiety disorder, unspecified: Secondary | ICD-10-CM | POA: Insufficient documentation

## 2016-05-22 DIAGNOSIS — Z1501 Genetic susceptibility to malignant neoplasm of breast: Secondary | ICD-10-CM | POA: Insufficient documentation

## 2016-05-22 DIAGNOSIS — Z8249 Family history of ischemic heart disease and other diseases of the circulatory system: Secondary | ICD-10-CM | POA: Insufficient documentation

## 2016-05-22 DIAGNOSIS — Z8349 Family history of other endocrine, nutritional and metabolic diseases: Secondary | ICD-10-CM | POA: Insufficient documentation

## 2016-05-22 DIAGNOSIS — Z8261 Family history of arthritis: Secondary | ICD-10-CM | POA: Insufficient documentation

## 2016-05-22 DIAGNOSIS — Z803 Family history of malignant neoplasm of breast: Secondary | ICD-10-CM | POA: Insufficient documentation

## 2016-05-22 DIAGNOSIS — R011 Cardiac murmur, unspecified: Secondary | ICD-10-CM | POA: Insufficient documentation

## 2016-05-22 DIAGNOSIS — D242 Benign neoplasm of left breast: Secondary | ICD-10-CM | POA: Insufficient documentation

## 2016-05-22 DIAGNOSIS — Z1509 Genetic susceptibility to other malignant neoplasm: Secondary | ICD-10-CM

## 2016-05-22 DIAGNOSIS — Z800 Family history of malignant neoplasm of digestive organs: Secondary | ICD-10-CM | POA: Insufficient documentation

## 2016-05-22 DIAGNOSIS — Z811 Family history of alcohol abuse and dependence: Secondary | ICD-10-CM | POA: Insufficient documentation

## 2016-05-22 HISTORY — PX: NIPPLE SPARING MASTECTOMY: SHX6537

## 2016-05-22 HISTORY — PX: BREAST RECONSTRUCTION WITH PLACEMENT OF TISSUE EXPANDER AND FLEX HD (ACELLULAR HYDRATED DERMIS): SHX6295

## 2016-05-22 HISTORY — DX: Recurrent oral aphthae: K12.0

## 2016-05-22 SURGERY — MASTECTOMY, NIPPLE SPARING
Anesthesia: General | Site: Breast | Laterality: Bilateral

## 2016-05-22 MED ORDER — CHLORHEXIDINE GLUCONATE CLOTH 2 % EX PADS: 6.0000 | MEDICATED_PAD | Freq: Once | CUTANEOUS | Status: DC

## 2016-05-22 MED ORDER — ACETAMINOPHEN 500 MG PO TABS
1000.0000 mg | ORAL_TABLET | ORAL | Status: AC
  Administered 2016-05-22: 1000 mg via ORAL

## 2016-05-22 MED ORDER — PROPOFOL 10 MG/ML IV BOLUS
INTRAVENOUS | Status: AC
  Filled 2016-05-22: qty 20

## 2016-05-22 MED ORDER — VANCOMYCIN HCL IN DEXTROSE 1-5 GM/200ML-% IV SOLN
1000.0000 mg | INTRAVENOUS | Status: AC
  Administered 2016-05-22: 1000 mg via INTRAVENOUS

## 2016-05-22 MED ORDER — FENTANYL CITRATE (PF) 100 MCG/2ML IJ SOLN
INTRAMUSCULAR | Status: AC
  Filled 2016-05-22: qty 2

## 2016-05-22 MED ORDER — LIDOCAINE HCL (CARDIAC) 20 MG/ML IV SOLN
INTRAVENOUS | Status: DC | PRN
  Administered 2016-05-22: 30 mg via INTRAVENOUS

## 2016-05-22 MED ORDER — GABAPENTIN 300 MG PO CAPS
300.0000 mg | ORAL_CAPSULE | ORAL | Status: AC
  Administered 2016-05-22: 300 mg via ORAL

## 2016-05-22 MED ORDER — DEXAMETHASONE SODIUM PHOSPHATE 10 MG/ML IJ SOLN
INTRAMUSCULAR | Status: AC
  Filled 2016-05-22: qty 1

## 2016-05-22 MED ORDER — ACETAMINOPHEN 500 MG PO TABS
ORAL_TABLET | ORAL | Status: AC
  Filled 2016-05-22: qty 2

## 2016-05-22 MED ORDER — VANCOMYCIN HCL IN DEXTROSE 1-5 GM/200ML-% IV SOLN
INTRAVENOUS | Status: AC
  Filled 2016-05-22: qty 200

## 2016-05-22 MED ORDER — FENTANYL CITRATE (PF) 100 MCG/2ML IJ SOLN
50.0000 ug | INTRAMUSCULAR | Status: DC | PRN
  Administered 2016-05-22: 50 ug via INTRAVENOUS
  Administered 2016-05-22: 100 ug via INTRAVENOUS

## 2016-05-22 MED ORDER — FENTANYL CITRATE (PF) 100 MCG/2ML IJ SOLN: 25.0000 ug | INTRAMUSCULAR | Status: DC | PRN

## 2016-05-22 MED ORDER — HYDROMORPHONE HCL 1 MG/ML IJ SOLN
INTRAMUSCULAR | Status: AC
  Filled 2016-05-22: qty 1

## 2016-05-22 MED ORDER — MIDAZOLAM HCL 2 MG/2ML IJ SOLN
INTRAMUSCULAR | Status: AC
  Filled 2016-05-22: qty 2

## 2016-05-22 MED ORDER — EPHEDRINE SULFATE 50 MG/ML IJ SOLN
INTRAMUSCULAR | Status: DC | PRN
  Administered 2016-05-22 (×3): 10 mg via INTRAVENOUS

## 2016-05-22 MED ORDER — METHYLENE BLUE 0.5 % INJ SOLN
INTRAVENOUS | Status: AC
  Filled 2016-05-22: qty 10

## 2016-05-22 MED ORDER — SIMETHICONE 80 MG PO CHEW: 40.0000 mg | CHEWABLE_TABLET | Freq: Four times a day (QID) | ORAL | Status: DC | PRN

## 2016-05-22 MED ORDER — ROCURONIUM BROMIDE 10 MG/ML (PF) SYRINGE
PREFILLED_SYRINGE | INTRAVENOUS | Status: AC
  Filled 2016-05-22: qty 10

## 2016-05-22 MED ORDER — HYDROMORPHONE HCL 1 MG/ML IJ SOLN
0.2500 mg | INTRAMUSCULAR | Status: DC | PRN
  Administered 2016-05-22 (×4): 0.5 mg via INTRAVENOUS

## 2016-05-22 MED ORDER — PROMETHAZINE HCL 25 MG/ML IJ SOLN
6.2500 mg | INTRAMUSCULAR | Status: DC | PRN
Start: 2016-05-22 — End: 2016-05-23

## 2016-05-22 MED ORDER — SODIUM CHLORIDE 0.9 % IJ SOLN
INTRAMUSCULAR | Status: AC
  Filled 2016-05-22: qty 10

## 2016-05-22 MED ORDER — LIDOCAINE 2% (20 MG/ML) 5 ML SYRINGE
INTRAMUSCULAR | Status: AC
  Filled 2016-05-22: qty 5

## 2016-05-22 MED ORDER — ONDANSETRON HCL 4 MG/2ML IJ SOLN
INTRAMUSCULAR | Status: AC
  Filled 2016-05-22: qty 2

## 2016-05-22 MED ORDER — OXYCODONE HCL 5 MG PO TABS
5.0000 mg | ORAL_TABLET | ORAL | Status: DC | PRN
  Administered 2016-05-22 – 2016-05-23 (×3): 5 mg via ORAL
  Filled 2016-05-22 (×3): qty 1

## 2016-05-22 MED ORDER — MIDAZOLAM HCL 2 MG/2ML IJ SOLN
1.0000 mg | INTRAMUSCULAR | Status: DC | PRN
  Administered 2016-05-22 (×2): 2 mg via INTRAVENOUS

## 2016-05-22 MED ORDER — SUGAMMADEX SODIUM 500 MG/5ML IV SOLN
INTRAVENOUS | Status: DC | PRN
  Administered 2016-05-22: 275 mg via INTRAVENOUS

## 2016-05-22 MED ORDER — EPHEDRINE 5 MG/ML INJ
INTRAVENOUS | Status: AC
  Filled 2016-05-22: qty 10

## 2016-05-22 MED ORDER — ROCURONIUM BROMIDE 100 MG/10ML IV SOLN
INTRAVENOUS | Status: DC | PRN
  Administered 2016-05-22: 20 mg via INTRAVENOUS
  Administered 2016-05-22: 50 mg via INTRAVENOUS
  Administered 2016-05-22: 20 mg via INTRAVENOUS

## 2016-05-22 MED ORDER — SCOPOLAMINE 1 MG/3DAYS TD PT72: 1.0000 | MEDICATED_PATCH | Freq: Once | TRANSDERMAL | Status: DC | PRN

## 2016-05-22 MED ORDER — BUPIVACAINE HCL (PF) 0.25 % IJ SOLN
INTRAMUSCULAR | Status: AC
  Filled 2016-05-22: qty 60

## 2016-05-22 MED ORDER — METHOCARBAMOL 500 MG PO TABS: 500.0000 mg | ORAL_TABLET | Freq: Four times a day (QID) | ORAL | Status: DC | PRN

## 2016-05-22 MED ORDER — BUPIVACAINE-EPINEPHRINE (PF) 0.5% -1:200000 IJ SOLN
INTRAMUSCULAR | Status: AC
  Filled 2016-05-22: qty 60

## 2016-05-22 MED ORDER — DEXAMETHASONE SODIUM PHOSPHATE 4 MG/ML IJ SOLN
INTRAMUSCULAR | Status: DC | PRN
  Administered 2016-05-22: 10 mg via INTRAVENOUS

## 2016-05-22 MED ORDER — MORPHINE SULFATE (PF) 2 MG/ML IV SOLN: 2.0000 mg | INTRAVENOUS | Status: DC | PRN

## 2016-05-22 MED ORDER — KETOROLAC TROMETHAMINE 30 MG/ML IJ SOLN
INTRAMUSCULAR | Status: AC
  Filled 2016-05-22: qty 1

## 2016-05-22 MED ORDER — ACETAMINOPHEN 650 MG RE SUPP: 650.0000 mg | Freq: Four times a day (QID) | RECTAL | Status: DC | PRN

## 2016-05-22 MED ORDER — GLYCOPYRROLATE 0.2 MG/ML IJ SOLN: 0.2000 mg | Freq: Once | INTRAMUSCULAR | Status: DC | PRN

## 2016-05-22 MED ORDER — LACTATED RINGERS IV SOLN
INTRAVENOUS | Status: DC
  Administered 2016-05-22 (×3): via INTRAVENOUS

## 2016-05-22 MED ORDER — ACETAMINOPHEN 325 MG PO TABS
650.0000 mg | ORAL_TABLET | Freq: Four times a day (QID) | ORAL | Status: DC | PRN
  Administered 2016-05-22 – 2016-05-23 (×2): 650 mg via ORAL
  Filled 2016-05-22 (×2): qty 2

## 2016-05-22 MED ORDER — KETOROLAC TROMETHAMINE 30 MG/ML IJ SOLN
30.0000 mg | Freq: Three times a day (TID) | INTRAMUSCULAR | Status: DC
  Administered 2016-05-22 – 2016-05-23 (×2): 30 mg via INTRAVENOUS

## 2016-05-22 MED ORDER — ROPIVACAINE HCL 5 MG/ML IJ SOLN
INTRAMUSCULAR | Status: DC | PRN
  Administered 2016-05-22 (×2): 20 mL via PERINEURAL

## 2016-05-22 MED ORDER — CIPROFLOXACIN IN D5W 400 MG/200ML IV SOLN
400.0000 mg | Freq: Two times a day (BID) | INTRAVENOUS | Status: AC
  Administered 2016-05-22 – 2016-05-23 (×2): 400 mg via INTRAVENOUS
  Filled 2016-05-22 (×2): qty 200

## 2016-05-22 MED ORDER — ONDANSETRON HCL 4 MG/2ML IJ SOLN
INTRAMUSCULAR | Status: DC | PRN
  Administered 2016-05-22: 4 mg via INTRAVENOUS

## 2016-05-22 MED ORDER — PROMETHAZINE HCL 25 MG/ML IJ SOLN
12.5000 mg | Freq: Four times a day (QID) | INTRAMUSCULAR | Status: DC | PRN
  Administered 2016-05-22: 12.5 mg via INTRAVENOUS
  Filled 2016-05-22: qty 1

## 2016-05-22 MED ORDER — SODIUM CHLORIDE 0.9 % IV SOLN
INTRAVENOUS | Status: DC
  Administered 2016-05-22: 14:00:00 via INTRAVENOUS

## 2016-05-22 MED ORDER — PROPOFOL 10 MG/ML IV BOLUS
INTRAVENOUS | Status: DC | PRN
  Administered 2016-05-22: 150 mg via INTRAVENOUS

## 2016-05-22 MED ORDER — GABAPENTIN 300 MG PO CAPS
ORAL_CAPSULE | ORAL | Status: AC
  Filled 2016-05-22: qty 1

## 2016-05-22 SURGICAL SUPPLY — 100 items
APPLIER CLIP 11 MED OPEN (CLIP) ×4
BAG DECANTER FOR FLEXI CONT (MISCELLANEOUS) ×8 IMPLANT
BENZOIN TINCTURE PRP APPL 2/3 (GAUZE/BANDAGES/DRESSINGS) IMPLANT
BINDER BREAST LRG (GAUZE/BANDAGES/DRESSINGS) ×8 IMPLANT
BINDER BREAST MEDIUM (GAUZE/BANDAGES/DRESSINGS) IMPLANT
BINDER BREAST XLRG (GAUZE/BANDAGES/DRESSINGS) IMPLANT
BINDER BREAST XXLRG (GAUZE/BANDAGES/DRESSINGS) IMPLANT
BIOPATCH RED 1 DISK 7.0 (GAUZE/BANDAGES/DRESSINGS) ×6 IMPLANT
BIOPATCH RED 1IN DISK 7.0MM (GAUZE/BANDAGES/DRESSINGS) ×2
BLADE CLIPPER SURG (BLADE) IMPLANT
BLADE HEX COATED 2.75 (ELECTRODE) ×4 IMPLANT
BLADE PHOTON ILLUMINATED (MISCELLANEOUS) ×4 IMPLANT
BLADE SURG 10 STRL SS (BLADE) ×8 IMPLANT
BLADE SURG 15 STRL LF DISP TIS (BLADE) ×2 IMPLANT
BLADE SURG 15 STRL SS (BLADE) ×2
BNDG GAUZE ELAST 4 BULKY (GAUZE/BANDAGES/DRESSINGS) ×8 IMPLANT
CANISTER SUCT 1200ML W/VALVE (MISCELLANEOUS) ×8 IMPLANT
CHLORAPREP W/TINT 26ML (MISCELLANEOUS) ×8 IMPLANT
CLIP APPLIE 11 MED OPEN (CLIP) ×2 IMPLANT
CLIP TI WIDE RED SMALL 6 (CLIP) ×4 IMPLANT
CLOSURE WOUND 1/2 X4 (GAUZE/BANDAGES/DRESSINGS)
COVER BACK TABLE 60X90IN (DRAPES) ×4 IMPLANT
COVER MAYO STAND STRL (DRAPES) ×8 IMPLANT
COVER PROBE W GEL 5X96 (DRAPES) IMPLANT
DECANTER SPIKE VIAL GLASS SM (MISCELLANEOUS) IMPLANT
DERMABOND ADVANCED (GAUZE/BANDAGES/DRESSINGS) ×4
DERMABOND ADVANCED .7 DNX12 (GAUZE/BANDAGES/DRESSINGS) ×4 IMPLANT
DRAIN CHANNEL 15F RND FF W/TCR (WOUND CARE) ×4 IMPLANT
DRAIN CHANNEL 19F RND (DRAIN) IMPLANT
DRAPE LAPAROSCOPIC ABDOMINAL (DRAPES) ×4 IMPLANT
DRAPE U-SHAPE 76X120 STRL (DRAPES) IMPLANT
DRAPE UTILITY XL STRL (DRAPES) ×4 IMPLANT
DRSG PAD ABDOMINAL 8X10 ST (GAUZE/BANDAGES/DRESSINGS) ×16 IMPLANT
DRSG TEGADERM 4X10 (GAUZE/BANDAGES/DRESSINGS) IMPLANT
DRSG TEGADERM 4X4.75 (GAUZE/BANDAGES/DRESSINGS) IMPLANT
ELECT BLADE 4.0 EZ CLEAN MEGAD (MISCELLANEOUS) ×4
ELECT BLADE 6.5 .24CM SHAFT (ELECTRODE) ×4 IMPLANT
ELECT COATED BLADE 2.86 ST (ELECTRODE) ×4 IMPLANT
ELECT REM PT RETURN 9FT ADLT (ELECTROSURGICAL) ×8
ELECTRODE BLDE 4.0 EZ CLN MEGD (MISCELLANEOUS) ×2 IMPLANT
ELECTRODE REM PT RTRN 9FT ADLT (ELECTROSURGICAL) ×4 IMPLANT
EVACUATOR SILICONE 100CC (DRAIN) ×8 IMPLANT
EXPANDER TISSUE MX 400CC (Breast) ×4 IMPLANT
GAUZE SPONGE 4X4 12PLY STRL (GAUZE/BANDAGES/DRESSINGS) ×4 IMPLANT
GLOVE BIO SURGEON STRL SZ 6 (GLOVE) ×12 IMPLANT
GLOVE BIO SURGEON STRL SZ 6.5 (GLOVE) ×3 IMPLANT
GLOVE BIO SURGEON STRL SZ7 (GLOVE) ×8 IMPLANT
GLOVE BIO SURGEONS STRL SZ 6.5 (GLOVE) ×1
GLOVE BIOGEL PI IND STRL 7.5 (GLOVE) ×4 IMPLANT
GLOVE BIOGEL PI IND STRL 8 (GLOVE) ×6 IMPLANT
GLOVE BIOGEL PI INDICATOR 7.5 (GLOVE) ×4
GLOVE BIOGEL PI INDICATOR 8 (GLOVE) ×6
GOWN STRL REUS W/ TWL LRG LVL3 (GOWN DISPOSABLE) ×10 IMPLANT
GOWN STRL REUS W/TWL LRG LVL3 (GOWN DISPOSABLE) ×10
ILLUMINATOR WAVEGUIDE N/F (MISCELLANEOUS) IMPLANT
IV NS 500ML (IV SOLUTION) ×4
IV NS 500ML BAXH (IV SOLUTION) ×4 IMPLANT
KIT FILL SYSTEM UNIVERSAL (SET/KITS/TRAYS/PACK) ×4 IMPLANT
KIT MARKER MARGIN INK (KITS) IMPLANT
LIGHT WAVEGUIDE WIDE FLAT (MISCELLANEOUS) ×4 IMPLANT
LIQUID BAND (GAUZE/BANDAGES/DRESSINGS) ×8 IMPLANT
NDL SAFETY ECLIPSE 18X1.5 (NEEDLE) IMPLANT
NEEDLE HYPO 18GX1.5 SHARP (NEEDLE)
NEEDLE HYPO 25X1 1.5 SAFETY (NEEDLE) IMPLANT
NS IRRIG 1000ML POUR BTL (IV SOLUTION) ×4 IMPLANT
PACK BASIN DAY SURGERY FS (CUSTOM PROCEDURE TRAY) ×8 IMPLANT
PACK UNIVERSAL I (CUSTOM PROCEDURE TRAY) ×4 IMPLANT
PENCIL BUTTON HOLSTER BLD 10FT (ELECTRODE) ×4 IMPLANT
PIN SAFETY STERILE (MISCELLANEOUS) ×4 IMPLANT
SHEET MEDIUM DRAPE 40X70 STRL (DRAPES) ×8 IMPLANT
SLEEVE SCD COMPRESS KNEE MED (MISCELLANEOUS) ×4 IMPLANT
SPONGE LAP 18X18 X RAY DECT (DISPOSABLE) ×12 IMPLANT
STAPLER VISISTAT 35W (STAPLE) IMPLANT
STRIP CLOSURE SKIN 1/2X4 (GAUZE/BANDAGES/DRESSINGS) IMPLANT
SUT ETHIBOND 2-0 V-5 NEEDLE (SUTURE) IMPLANT
SUT ETHILON 2 0 FS 18 (SUTURE) IMPLANT
SUT ETHILON 3 0 PS 1 (SUTURE) IMPLANT
SUT MNCRL AB 3-0 PS2 18 (SUTURE) IMPLANT
SUT MNCRL AB 4-0 PS2 18 (SUTURE) ×8 IMPLANT
SUT MON AB 5-0 PS2 18 (SUTURE) IMPLANT
SUT PDS AB 2-0 CT2 27 (SUTURE) IMPLANT
SUT SILK 2 0 SH (SUTURE) IMPLANT
SUT SILK 3 0 PS 1 (SUTURE) ×4 IMPLANT
SUT VIC AB 0 CT1 27 (SUTURE) ×4
SUT VIC AB 0 CT1 27XBRD ANBCTR (SUTURE) ×4 IMPLANT
SUT VIC AB 3-0 SH 27 (SUTURE) ×8
SUT VIC AB 3-0 SH 27X BRD (SUTURE) ×8 IMPLANT
SUT VICRYL 4-0 PS2 18IN ABS (SUTURE) ×8 IMPLANT
SYR 50ML LL SCALE MARK (SYRINGE) ×8 IMPLANT
SYR BULB IRRIGATION 50ML (SYRINGE) ×4 IMPLANT
SYR CONTROL 10ML LL (SYRINGE) ×4 IMPLANT
TAPE MEASURE VINYL STERILE (MISCELLANEOUS) ×4 IMPLANT
TISSUE EXPANDER MX 400CC (Breast) ×8 IMPLANT
TOWEL OR 17X24 6PK STRL BLUE (TOWEL DISPOSABLE) ×16 IMPLANT
TRAY FOLEY BAG SILVER LF 14FR (SET/KITS/TRAYS/PACK) ×4 IMPLANT
TRAY FOLEY BAG SILVER LF 16FR (SET/KITS/TRAYS/PACK) IMPLANT
TUBE CONNECTING 20'X1/4 (TUBING) ×1
TUBE CONNECTING 20X1/4 (TUBING) ×3 IMPLANT
UNDERPAD 30X30 (UNDERPADS AND DIAPERS) ×8 IMPLANT
YANKAUER SUCT BULB TIP NO VENT (SUCTIONS) ×4 IMPLANT

## 2016-05-22 NOTE — Anesthesia Preprocedure Evaluation (Signed)
Anesthesia Evaluation Anesthesia Physical Anesthesia Plan Anesthesia Quick Evaluation  

## 2016-05-22 NOTE — Op Note (Signed)
Operative Note   DATE OF OPERATION: 10.13.17  LOCATION: [facility]-observation  SURGICAL DIVISION: Plastic Surgery  PREOPERATIVE DIAGNOSES:  1. BRCA1 genetic mutation 2. MUTYH genetic mutation  POSTOPERATIVE DIAGNOSES:  same  PROCEDURE:  1. Bilateral breast reconstruction with tissue expanders  SURGEON: Irene Limbo MD MBA  ASSISTANT: none  ANESTHESIA:  General.   EBL: 150 ml for entire case  COMPLICATIONS: None immediate.   INDICATIONS FOR PROCEDURE:  The patient, Michelle Cervantes, is a 35 y.o. female born on [birth year], is here for immediate expander based reconstruction following bilateral nipple sparing mastectomies.   FINDINGS: Natrelle 133MX-12-T 400 ml tissue expanders placed bilateral, initial fill volume 200 ml. RIGHT SN [id number] LEFT SN [id number]  DESCRIPTION OF PROCEDURE:  The patient was marked with the patient in the preoperative area to mark sternal notch, chest midline, anterior axillary lines and inframammary folds. The patient was taken to the operating room. SCDs were placed and IV antibiotics were given. The patient's operative site was prepped and draped in a sterile fashion. A time out was performed and all information was confirmed to be correct. Following completion of mastectomies, reconstruction began on left side. The inferior insertions of pectoralis major muscle were elevated continuous with abdominal wall fascia. Submuscular dissection completed toward clavicle. The anterior rectus fascia was elevated 1-2 cm below inframammary fold. The serratus fascia was elevated laterally. A 19 Fr drain was placed and secured to skin with 2-0 nylon. The cavity was irrigated with solution containing polymyxinand bacitracin. Hemostasis was ensured. Cavity irrigated with Betadine. The tissue expander was prepared and placed in submuscular position. The expander was secured to chest wall with a 3-0 vicryl. The lateral border of pectoralis was secured to elevated  serratus fascia with 3-0 vicryl in figure of eight fashion. The mastectomy flap soft tissue over axilla was then advanced inferiorly and secured to anterior surface pectoralis with 0-vicryl. Laterally the mastectomy flap was advanced anteriorly and the subcutaneous tissue and superficial fascia was secured to pectoralis muscle with 0-vicryl. The incision was closed with 3-0 vicryl in fascial layer and 4-0 vicryl in dermis. Skin closure completed with 4-0 monocryl subcuticular and tissue adhesive. Over right breast, similarly the inferior pectoralis insertions were divided and anterior rectus fascia elevated for below inframammary fold. The serratus muscle was elevated laterally. A 19 Fr drain was placed submuscular medially and subcutaneous laterally and secured to skin with 2-0 nylon. The mastectomy flap soft tissue over axilla was then advanced inferiorly and secured to anterior surface pectoralis with 0-vicryl. Laterally the mastectomy flap was advanced anteriorly and the subcutaneous tissue and superficial fascia was secured to pectoralis muscle with 0-vicryl.  Closure completed in similar fashion. The ports were accessed and filled to 200 ml bilaterally. The patient was brought to upright position and the skin flaps were redraped so that NAC was symmetric from the sternal notch and midline. Transparent, adherent dressings applied. Dry dressing and breast binder applied.  The patient was allowed to wake from anesthesia, extubated and taken to the recovery room in satisfactory condition.   SPECIMENS: none  DRAINS: 19 Fr drain in each breast  Irene Limbo, MD [facility] Plastic & Reconstructive Surgery [phone], pin [phone]

## 2016-05-22 NOTE — Interval H&P Note (Signed)
History and Physical Interval Note:  05/22/2016 8:26 AM  Michelle Cervantes  has presented today for surgery, with the diagnosis of BRCA 1 POSITIVE AND MUTYH  The various methods of treatment have been discussed with the patient and family. After consideration of risks, benefits and other options for treatment, the patient has consented to  Procedure(s): BILATERAL PROPHYLACTIC NIPPLE SPARING MASTECTOMY (Bilateral) BILATERAL BREAST RECONSTRUCTION WITH PLACEMENT OF TISSUE EXPANDER AND POSSIBLE CORTIVA (Bilateral) as a surgical intervention .  The patient's history has been reviewed, patient examined, no change in status, stable for surgery.  I have reviewed the patient's chart and labs.  Questions were answered to the patient's satisfaction.     Jef Futch

## 2016-05-22 NOTE — Op Note (Signed)
Preoperative diagnosis: BRCA mutation Postoperative diagnosis: same as above Procedure: Right prophylactic nsm, Left prophylactic nipple sparing mastectomy Surgeon: Dr Serita Grammes Asst: Dr Irene Limbo Anesthesia: general with bilateral pectoral block EBL: 100 cc Drains per plastic surgery Specimens: right and left breast marked short superior long lateral and double mark nipple areolar margin, right and left nipples Complications none Sponge and needle count correct Dispo case turned over to plastics  Indications: This is a 79 yof with brca mutation. We discussed all options and she desires surgical therapy for prophylaxis. We discussed bilateral nsm with immediate reconstruction.   Procedure: After informed consent was obtained the patient was taken to the OR. She was given antibiotics and scds were in place. She had bilateral pectoralblocks. She was placed under general anesthesia without complication. She was prepped and draped in the standard sterile surgical fashion. A timeout was performed.   I made a 10cm inframammary incision about 9cm from sternum on the leftside initially. I then created a posterior flap removing the fascia from the pectoralis muscle. This was done to the parasternal region, clavicle and latissimus laterally. I then created the anterior flap. The breast was then removed in its entirety. The skin and nac were all viable. The breast was marked as above. I removed the nipple margin as a separate specimen. Hemostasis was obtained. I then packed this side and went to the rightside.  I then made a 10cm incision on the right in the IM fold. This was 9cm from the sternum. I then removed the breast tissue in same fashion as above. I also removed the nipple specimen and sent this separately. Hemostasis was obtained. Case was turned over to plastic surgery for completion.

## 2016-05-22 NOTE — Transfer of Care (Signed)
Immediate Anesthesia Transfer of Care Note  Patient: Michelle Cervantes  Procedure(s) Performed: Procedure(s): BILATERAL PROPHYLACTIC NIPPLE SPARING MASTECTOMY (Bilateral) BILATERAL BREAST RECONSTRUCTION WITH PLACEMENT OF TISSUE EXPANDER AND POSSIBLE CORTIVA (Bilateral)  Patient Location: PACU  Anesthesia Type:GA combined with regional for post-op pain  Level of Consciousness: awake, alert , oriented and patient cooperative  Airway & Oxygen Therapy: Patient Spontanous Breathing and Patient connected to face mask oxygen  Post-op Assessment: Report given to RN and Post -op Vital signs reviewed and stable  Post vital signs: Reviewed and stable  Last Vitals:  Vitals:   05/22/16 0755 05/22/16 1245  BP: 102/64 112/73  Pulse: 69 98  Resp: 18 16  Temp:      Last Pain:  Vitals:   05/22/16 0705  TempSrc: Oral         Complications: No apparent anesthesia complications

## 2016-05-22 NOTE — Anesthesia Preprocedure Evaluation (Incomplete Revision)
Anesthesia Evaluation  Patient identified by MRN, date of birth, ID band Patient awake    Reviewed: Allergy & Precautions, NPO status , Patient's Chart, lab work & pertinent test results  Airway        Dental   Pulmonary           Cardiovascular      Neuro/Psych    GI/Hepatic   Endo/Other    Renal/GU      Musculoskeletal   Abdominal   Peds  Hematology   Anesthesia Other Findings   Reproductive/Obstetrics                             Anesthesia Physical Anesthesia Plan Anesthesia Quick Evaluation

## 2016-05-22 NOTE — Anesthesia Procedure Notes (Signed)
Anesthesia Regional Block:  Pectoralis block  Pre-Anesthetic Checklist: ,, timeout performed, Correct Patient, Correct Site, Correct Laterality, Correct Procedure, Correct Position, site marked, Risks and benefits discussed,  Surgical consent,  Pre-op evaluation,  At surgeon's request and post-op pain management  Laterality: Left and Right  Prep: chloraprep       Needles:  Injection technique: Single-shot  Needle Type: Echogenic Needle     Needle Length: 9cm 9 cm Needle Gauge: 21 and 21 G    Additional Needles:  Procedures: ultrasound guided (picture in chart) Pectoralis block Narrative:  Start time: 05/22/2016 7:40 AM End time: 05/22/2016 7:58 AM Injection made incrementally with aspirations every 5 mL.  Performed by: Personally  Anesthesiologist: Inocencia Murtaugh  Additional Notes: Pt tolerated the procedure well.  Bilateral blocks performed.  20 mL of 0.5% Ropivacaine was injected on each side.

## 2016-05-22 NOTE — Interval H&P Note (Signed)
History and Physical Interval Note:  05/22/2016 8:06 AM  Michelle Cervantes  has presented today for surgery, with the diagnosis of BRCA 1 POSITIVE AND MUTYH  The various methods of treatment have been discussed with the patient and family. After consideration of risks, benefits and other options for treatment, the patient has consented to  Procedure(s): BILATERAL PROPHYLACTIC NIPPLE SPARING MASTECTOMY (Bilateral) BILATERAL BREAST RECONSTRUCTION WITH PLACEMENT OF TISSUE EXPANDER AND POSSIBLE CORTIVA (Bilateral) as a surgical intervention .  The patient's history has been reviewed, patient examined, no change in status, stable for surgery.  I have reviewed the patient's chart and labs.  Questions were answered to the patient's satisfaction.     Jessi Jessop

## 2016-05-22 NOTE — Progress Notes (Signed)
Assisted Dr. Hodierne with right, left, ultrasound guided, pectoralis block. Side rails up, monitors on throughout procedure. See vital signs in flow sheet. Tolerated Procedure well. °

## 2016-05-22 NOTE — H&P (Signed)
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 She is seen in consultation from Dr Teresa Coombs. she has undergone a csc for another mutation already and has undergone hysterectomy/bso recently as well. I do not have genetics report today. will obtain that. she has significant family history on her dads side. she has no prior breast issues outside of mastitis when breast feeding. she has four daughters 22,67 and one year old twins. she has not breastfed since october and has no discharge. she has mm 4/17 with b density. this was normal. she also has undergone breast mri in June that shows nl right breast, nl nodes and a left breast focal nme measuring 9 mm. this underwent mr biopsy and is benign but discordant. she comes in today to discuss this and her brca mutation.    Other Problems  Anxiety Disorder Heart murmur Lump In Breast Oophorectomy  Past Surgical History  Breast Biopsy Left. Cesarean Section - 1 Hysterectomy (not due to cancer) - Complete  Diagnostic Studies History Mammogram within last year Pap Smear 1-5 years ago  Allergies  No Known Drug Allergies07/02/2016  Medication History  Doxycycline Hyclate (100MG Tablet, Oral) Active. Medications Reconciled  Social History  Alcohol use Occasional alcohol use. Caffeine use Carbonated beverages. No drug use Tobacco use Never smoker.  Family History Alcohol Abuse Brother, Father, Sister, Son. Arthritis Father. Colon Cancer Father. Colon Polyps Father. Hypertension Mother. Rectal Cancer Father. Thyroid problems Mother.  Pregnancy / Birth History  Age at menarche 54 years. Age of menopause <44 Gravida 3 Irregular periods Length (months) of breastfeeding 7-12 Maternal age 55-30 Para 4  Review of Systems  General Present- Fatigue, Night Sweats and Weight Gain. Not Present- Appetite Loss, Chills, Fever and Weight Loss. HEENT  Present- Seasonal Allergies and Visual Disturbances. Not Present- Earache, Hearing Loss, Hoarseness, Nose Bleed, Oral Ulcers, Ringing in the Ears, Sinus Pain, Sore Throat, Wears glasses/contact lenses and Yellow Eyes. Breast Present- Breast Mass, Breast Pain and Skin Changes. Not Present- Nipple Discharge. Cardiovascular Present- Swelling of Extremities. Not Present- Chest Pain, Difficulty Breathing Lying Down, Leg Cramps, Palpitations, Rapid Heart Rate and Shortness of Breath. Neurological Present- Decreased Memory, Headaches, Numbness, Tingling and Weakness. Not Present- Fainting, Seizures, Tremor and Trouble walking. Psychiatric Present- Anxiety and Change in Sleep Pattern. Not Present- Bipolar, Depression, Fearful and Frequent crying. Endocrine Present- Hot flashes. Not Present- Cold Intolerance, Excessive Hunger, Hair Changes, Heat Intolerance and New Diabetes.   Physical Exam  General Mental Status-Alert. Orientation-Oriented X3. Eye Sclera/Conjunctiva - Bilateral-No scleral icterus. Chest and Lung Exam Chest and lung exam reveals -on auscultation, normal breath sounds, no adventitious sounds and normal vocal resonance. Breast Nipples-No Discharge. Breast Lump-No Palpable Breast Mass. Cardiovascular Cardiovascular examination reveals -normal heart sounds, regular rate and rhythm with no murmurs. Lymphatic Head & Neck General Head & Neck Lymphatics: Bilateral - Description - Normal. Axillary General Axillary Region: Bilateral - Description - Normal. Note: no Dodge adenopathy  Assessment & Plan  BRCA1 POSITIVE (Z15.01) Bilateral nsm Story: discussed options for brca mutation including close follow up with mri and mm, exams. she understands this will not prevent breast cancer. I will obtain her genetic results. we also discussed risk reducing bilateral mastectomies. we discussed this is not 100% preventive but has been shown to reduce risk and is effective at cancer  prevention. I discussed with her bilateral nsm. I dont think we need to do anything more with mr lesion as  it will be removed at surgery. biopsy has been negative although discordant. we discussed nsm with risks including but not limited to bleeding, infection, loss of nipple, loss of nipple sensation, loss of skin, partial skin sloughing. we also discussed reconstruction.

## 2016-05-22 NOTE — Anesthesia Procedure Notes (Signed)
Procedure Name: Intubation Date/Time: 05/22/2016 8:36 AM Performed by: Ermina Oberman D Pre-anesthesia Checklist: Patient identified, Emergency Drugs available, Suction available and Patient being monitored Patient Re-evaluated:Patient Re-evaluated prior to inductionOxygen Delivery Method: Circle system utilized Preoxygenation: Pre-oxygenation with 100% oxygen Intubation Type: IV induction Ventilation: Mask ventilation without difficulty Laryngoscope Size: Mac and 3 Grade View: Grade I Tube type: Oral Tube size: 7.0 mm Number of attempts: 1 Airway Equipment and Method: Stylet and Oral airway Placement Confirmation: ETT inserted through vocal cords under direct vision,  positive ETCO2 and breath sounds checked- equal and bilateral Secured at: 21 cm Tube secured with: Tape Dental Injury: Teeth and Oropharynx as per pre-operative assessment

## 2016-05-23 MED ORDER — KETOROLAC TROMETHAMINE 30 MG/ML IJ SOLN
INTRAMUSCULAR | Status: AC
  Filled 2016-05-23: qty 1

## 2016-05-23 NOTE — Discharge Instructions (Signed)
About my Jackson-Pratt Bulb Drain ° °What is a Jackson-Pratt bulb? °A Jackson-Pratt is a soft, round device used to collect drainage. It is connected to a long, thin drainage catheter, which is held in place by one or two small stiches near your surgical incision site. When the bulb is squeezed, it forms a vacuum, forcing the drainage to empty into the bulb. ° °Emptying the Jackson-Pratt bulb- °To empty the bulb: °1. Release the plug on the top of the bulb. °2. Pour the bulb's contents into a measuring container which your nurse will provide. °3. Record the time emptied and amount of drainage. Empty the drain(s) as often as your     doctor or nurse recommends. ° °Date                  Time                    Amount (Drain 1)                 Amount (Drain 2) ° °_____________________________________________________________________ ° °_____________________________________________________________________ ° °_____________________________________________________________________ ° °_____________________________________________________________________ ° °_____________________________________________________________________ ° °_____________________________________________________________________ ° °_____________________________________________________________________ ° °_____________________________________________________________________ ° °Squeezing the Jackson-Pratt Bulb- °To squeeze the bulb: °1. Make sure the plug at the top of the bulb is open. °2. Squeeze the bulb tightly in your fist. You will hear air squeezing from the bulb. °3. Replace the plug while the bulb is squeezed. °4. Use a safety pin to attach the bulb to your clothing. This will keep the catheter from     pulling at the bulb insertion site. ° °When to call your doctor- °Call your doctor if: °· Drain site becomes red, swollen or hot. °· You have a fever greater than 101 degrees F. °· There is oozing at the drain site. °· Drain falls out (apply a guaze  bandage over the drain hole and secure it with tape). °· Drainage increases daily not related to activity patterns. (You will usually have more drainage when you are active than when you are resting.) °· Drainage has a bad odor. ° ° °Post Anesthesia Home Care Instructions ° °Activity: °Get plenty of rest for the remainder of the day. A responsible adult should stay with you for 24 hours following the procedure.  °For the next 24 hours, DO NOT: °-Drive a car °-Operate machinery °-Drink alcoholic beverages °-Take any medication unless instructed by your physician °-Make any legal decisions or sign important papers. ° °Meals: °Start with liquid foods such as gelatin or soup. Progress to regular foods as tolerated. Avoid greasy, spicy, heavy foods. If nausea and/or vomiting occur, drink only clear liquids until the nausea and/or vomiting subsides. Call your physician if vomiting continues. ° °Special Instructions/Symptoms: °Your throat may feel dry or sore from the anesthesia or the breathing tube placed in your throat during surgery. If this causes discomfort, gargle with warm salt water. The discomfort should disappear within 24 hours. ° °If you had a scopolamine patch placed behind your ear for the management of post- operative nausea and/or vomiting: ° °1. The medication in the patch is effective for 72 hours, after which it should be removed.  Wrap patch in a tissue and discard in the trash. Wash hands thoroughly with soap and water. °2. You may remove the patch earlier than 72 hours if you experience unpleasant side effects which may include dry mouth, dizziness or visual disturbances. °3. Avoid touching the patch. Wash your hands with soap and water after contact with the   patch. °  ° ° °

## 2016-05-25 ENCOUNTER — Encounter (HOSPITAL_BASED_OUTPATIENT_CLINIC_OR_DEPARTMENT_OTHER): Payer: Self-pay | Admitting: General Surgery

## 2016-05-26 NOTE — Anesthesia Preprocedure Evaluation (Signed)
Anesthesia Evaluation  Patient identified by MRN, date of birth, ID band Patient awake    Reviewed: Allergy & Precautions, H&P , NPO status , Patient's Chart, lab work & pertinent test results  Airway Mallampati: II   Neck ROM: full    Dental   Pulmonary neg pulmonary ROS,    breath sounds clear to auscultation       Cardiovascular negative cardio ROS   Rhythm:regular Rate:Normal     Neuro/Psych    GI/Hepatic   Endo/Other    Renal/GU      Musculoskeletal   Abdominal   Peds  Hematology   Anesthesia Other Findings   Reproductive/Obstetrics BRCA1 positive                             Anesthesia Physical Anesthesia Plan  ASA: I  Anesthesia Plan: General and Regional   Post-op Pain Management:  Regional for Post-op pain   Induction: Intravenous  Airway Management Planned: LMA  Additional Equipment:   Intra-op Plan:   Post-operative Plan:   Informed Consent: I have reviewed the patients History and Physical, chart, labs and discussed the procedure including the risks, benefits and alternatives for the proposed anesthesia with the patient or authorized representative who has indicated his/her understanding and acceptance.     Plan Discussed with: CRNA, Anesthesiologist and Surgeon  Anesthesia Plan Comments:         Anesthesia Quick Evaluation  

## 2016-06-01 NOTE — Anesthesia Postprocedure Evaluation (Signed)
Anesthesia Post Note  Patient: Michelle Cervantes  Procedure(s) Performed: Procedure(s) (LRB): BILATERAL PROPHYLACTIC NIPPLE SPARING MASTECTOMY (Bilateral) BILATERAL BREAST RECONSTRUCTION WITH PLACEMENT OF TISSUE EXPANDER AND POSSIBLE CORTIVA (Bilateral)  Anesthesia Post Evaluation  Last Vitals:  Vitals:   05/23/16 0549 05/23/16 0715  BP: (!) 86/48 (!) 101/55  Pulse: 85 75  Resp: 16   Temp: 36.3 C     Last Pain:  Vitals:   05/23/16 0745  TempSrc:   PainSc: [place]

## 2016-06-04 DIAGNOSIS — Z1589 Genetic susceptibility to other disease: Secondary | ICD-10-CM | POA: Insufficient documentation

## 2016-06-04 DIAGNOSIS — Z9013 Acquired absence of bilateral breasts and nipples: Secondary | ICD-10-CM | POA: Insufficient documentation

## 2016-07-09 NOTE — H&P (Signed)
Subjective:     Patient ID: Michelle Cervantes is a 35 y.o. female.  HPI  6 weeks post op bilateral NSM with TE reconstruction.  Diagnosed with BRCA1 and MUTYH 2015. Father with polyposis, paternal relatives with breast ca premenopausal. Underwent TAH/BSO and colonoscopy this year. Last MMG 11/2015 benign, last screening MRI 01/2016 with NME within the LEFT breast, 9 o'clock, 9 x 5 x 6 mm. Biopsy with benign tissue, felt to be discordant, and surgical excision recommended. Final pathology benign, fibroadenoma and biopsy site noted left breast.  Prior B cup, satisfied with this. Left mastectomy 397 g, right 355 g  Review of Systems     Objective:   Physical Exam  Cardiovascular: Normal rate, regular rhythm and normal heart sounds.   Pulmonary/Chest: Effort normal and breath sounds normal.  Abdominal: Soft.  No hernias    SN to nipple R 20.5 L 19.5 BW R 14 L 14 cm Nipple to IMF R 8 L 8 cm  Right lower pole less full/redundant mastectomy flap in this area  Assessment:     BRCA1, MUTYH mutations S/p bilateral NSM, TE reconstruction.    Plan:   Plan second stage for removal expanders and placement implants. Reviewed saline vs silicone, round vs shaped, textured vs smooth. Reviewed MRI surveillance to detect rupture silicone. Reviewed risks infection, capsular contracture, rippling. Discussed patients that desire more upper pole fullness/cleavage better suited for round implants. Reviewed purpose fat grafting to aid with contour thicken flaps. Reviewed donor site incisions, pain, bruising, risk fat necrosis, variable take graft, need to repeat, donor site contour abnormalities   Patient has elected for saline, plan smooth round. She does not have any significant contour depressions presently and will defer fat grafting for now. Plan OP surgery.  Natrelle 133MX-12-T 400 ml tissue expanders placed bilateral,  RIGHT fill volume 350 ml LEFT fill volume 350 ml      , MD MBA Plastic & Reconstructive Surgery [phone], pin [phone]    

## 2016-07-22 ENCOUNTER — Encounter (HOSPITAL_BASED_OUTPATIENT_CLINIC_OR_DEPARTMENT_OTHER): Payer: Self-pay | Admitting: *Deleted

## 2016-07-28 ENCOUNTER — Ambulatory Visit (HOSPITAL_BASED_OUTPATIENT_CLINIC_OR_DEPARTMENT_OTHER): Payer: BLUE CROSS/BLUE SHIELD | Admitting: Anesthesiology

## 2016-07-28 ENCOUNTER — Encounter (HOSPITAL_BASED_OUTPATIENT_CLINIC_OR_DEPARTMENT_OTHER)
Admission: RE | Disposition: A | Discharge status: Home / Self Care | Payer: Self-pay | Source: Ambulatory Visit | Attending: Plastic Surgery

## 2016-07-28 ENCOUNTER — Encounter (HOSPITAL_BASED_OUTPATIENT_CLINIC_OR_DEPARTMENT_OTHER): Payer: Self-pay

## 2016-07-28 ENCOUNTER — Ambulatory Visit (HOSPITAL_BASED_OUTPATIENT_CLINIC_OR_DEPARTMENT_OTHER)
Admission: RE | Admit: 2016-07-28 | Discharge: 2016-07-28 | Disposition: A | Discharge status: Home / Self Care | Payer: BLUE CROSS/BLUE SHIELD | Source: Ambulatory Visit | Attending: Plastic Surgery | Admitting: Plastic Surgery

## 2016-07-28 DIAGNOSIS — Z421 Encounter for breast reconstruction following mastectomy: Secondary | ICD-10-CM | POA: Insufficient documentation

## 2016-07-28 DIAGNOSIS — Z803 Family history of malignant neoplasm of breast: Secondary | ICD-10-CM | POA: Insufficient documentation

## 2016-07-28 HISTORY — PX: REMOVAL OF BILATERAL TISSUE EXPANDERS WITH PLACEMENT OF BILATERAL BREAST IMPLANTS: SHX6431

## 2016-07-28 SURGERY — REMOVAL, TISSUE EXPANDER, BREAST, BILATERAL, WITH BILATERAL IMPLANT IMPLANT INSERTION
Anesthesia: General | Site: Breast

## 2016-07-28 MED ORDER — FENTANYL CITRATE (PF) 100 MCG/2ML IJ SOLN
INTRAMUSCULAR | Status: AC
  Filled 2016-07-28: qty 2

## 2016-07-28 MED ORDER — GABAPENTIN 300 MG PO CAPS
ORAL_CAPSULE | ORAL | Status: AC
  Filled 2016-07-28: qty 1

## 2016-07-28 MED ORDER — CHLORHEXIDINE GLUCONATE CLOTH 2 % EX PADS: 6.0000 | MEDICATED_PAD | Freq: Once | CUTANEOUS | Status: DC

## 2016-07-28 MED ORDER — PHENYLEPHRINE 40 MCG/ML (10ML) SYRINGE FOR IV PUSH (FOR BLOOD PRESSURE SUPPORT)
PREFILLED_SYRINGE | INTRAVENOUS | Status: DC | PRN
  Administered 2016-07-28 (×2): 80 ug via INTRAVENOUS

## 2016-07-28 MED ORDER — CELECOXIB 400 MG PO CAPS
400.0000 mg | ORAL_CAPSULE | ORAL | Status: AC
  Administered 2016-07-28: 400 mg via ORAL

## 2016-07-28 MED ORDER — LACTATED RINGERS IV SOLN
INTRAVENOUS | Status: DC
  Administered 2016-07-28 (×3): via INTRAVENOUS

## 2016-07-28 MED ORDER — CLINDAMYCIN HCL 300 MG PO CAPS: 300.0000 mg | ORAL_CAPSULE | Freq: Three times a day (TID) | ORAL | 0 refills | Status: AC

## 2016-07-28 MED ORDER — HYDROCODONE-ACETAMINOPHEN 5-325 MG PO TABS: 1.0000 | ORAL_TABLET | Freq: Four times a day (QID) | ORAL | 0 refills | Status: AC | PRN

## 2016-07-28 MED ORDER — OXYCODONE HCL 5 MG PO TABS
ORAL_TABLET | ORAL | Status: AC
  Filled 2016-07-28: qty 1

## 2016-07-28 MED ORDER — MIDAZOLAM HCL 2 MG/2ML IJ SOLN
INTRAMUSCULAR | Status: AC
  Filled 2016-07-28: qty 2

## 2016-07-28 MED ORDER — ACETAMINOPHEN 500 MG PO TABS
ORAL_TABLET | ORAL | Status: AC
  Filled 2016-07-28: qty 2

## 2016-07-28 MED ORDER — ROCURONIUM BROMIDE 50 MG/5ML IV SOSY
PREFILLED_SYRINGE | INTRAVENOUS | Status: DC | PRN
  Administered 2016-07-28: 50 mg via INTRAVENOUS

## 2016-07-28 MED ORDER — PROPOFOL 10 MG/ML IV BOLUS
INTRAVENOUS | Status: AC
  Filled 2016-07-28: qty 20

## 2016-07-28 MED ORDER — GABAPENTIN 300 MG PO CAPS
300.0000 mg | ORAL_CAPSULE | ORAL | Status: AC
  Administered 2016-07-28: 300 mg via ORAL

## 2016-07-28 MED ORDER — OXYCODONE HCL 5 MG PO TABS
5.0000 mg | ORAL_TABLET | Freq: Once | ORAL | Status: AC
  Administered 2016-07-28: 5 mg via ORAL

## 2016-07-28 MED ORDER — MIDAZOLAM HCL 2 MG/2ML IJ SOLN
1.0000 mg | INTRAMUSCULAR | Status: DC | PRN
  Administered 2016-07-28: 2 mg via INTRAVENOUS

## 2016-07-28 MED ORDER — FENTANYL CITRATE (PF) 100 MCG/2ML IJ SOLN
50.0000 ug | INTRAMUSCULAR | Status: DC | PRN
  Administered 2016-07-28: 100 ug via INTRAVENOUS
  Administered 2016-07-28: 50 ug via INTRAVENOUS

## 2016-07-28 MED ORDER — ACETAMINOPHEN 500 MG PO TABS
1000.0000 mg | ORAL_TABLET | ORAL | Status: AC
  Administered 2016-07-28: 1000 mg via ORAL

## 2016-07-28 MED ORDER — DEXAMETHASONE SODIUM PHOSPHATE 4 MG/ML IJ SOLN
INTRAMUSCULAR | Status: DC | PRN
  Administered 2016-07-28: 10 mg via INTRAVENOUS

## 2016-07-28 MED ORDER — SCOPOLAMINE 1 MG/3DAYS TD PT72
MEDICATED_PATCH | TRANSDERMAL | Status: AC
  Filled 2016-07-28: qty 1

## 2016-07-28 MED ORDER — LIDOCAINE HCL (PF) 1 % IJ SOLN
INTRAMUSCULAR | Status: AC
  Filled 2016-07-28: qty 30

## 2016-07-28 MED ORDER — ONDANSETRON HCL 4 MG/2ML IJ SOLN
INTRAMUSCULAR | Status: DC | PRN
  Administered 2016-07-28: 4 mg via INTRAVENOUS

## 2016-07-28 MED ORDER — LIDOCAINE 2% (20 MG/ML) 5 ML SYRINGE
INTRAMUSCULAR | Status: DC | PRN
  Administered 2016-07-28: 75 mg via INTRAVENOUS

## 2016-07-28 MED ORDER — ONDANSETRON HCL 4 MG/2ML IJ SOLN
INTRAMUSCULAR | Status: AC
  Filled 2016-07-28: qty 2

## 2016-07-28 MED ORDER — VANCOMYCIN HCL IN DEXTROSE 1-5 GM/200ML-% IV SOLN
1000.0000 mg | INTRAVENOUS | Status: AC
  Administered 2016-07-28: 1000 mg via INTRAVENOUS

## 2016-07-28 MED ORDER — LIDOCAINE 2% (20 MG/ML) 5 ML SYRINGE
INTRAMUSCULAR | Status: AC
  Filled 2016-07-28: qty 5

## 2016-07-28 MED ORDER — EPINEPHRINE 30 MG/30ML IJ SOLN
INTRAMUSCULAR | Status: AC
  Filled 2016-07-28: qty 1

## 2016-07-28 MED ORDER — NEOSTIGMINE METHYLSULFATE 5 MG/5ML IV SOSY
PREFILLED_SYRINGE | INTRAVENOUS | Status: AC
  Filled 2016-07-28: qty 5

## 2016-07-28 MED ORDER — DEXAMETHASONE SODIUM PHOSPHATE 10 MG/ML IJ SOLN
INTRAMUSCULAR | Status: AC
  Filled 2016-07-28: qty 1

## 2016-07-28 MED ORDER — VANCOMYCIN HCL IN DEXTROSE 1-5 GM/200ML-% IV SOLN
INTRAVENOUS | Status: AC
  Filled 2016-07-28: qty 200

## 2016-07-28 MED ORDER — SODIUM BICARBONATE 4 % IV SOLN
INTRAVENOUS | Status: AC
  Filled 2016-07-28: qty 10

## 2016-07-28 MED ORDER — FENTANYL CITRATE (PF) 100 MCG/2ML IJ SOLN
25.0000 ug | INTRAMUSCULAR | Status: DC | PRN
  Administered 2016-07-28: 25 ug via INTRAVENOUS
  Administered 2016-07-28: 50 ug via INTRAVENOUS

## 2016-07-28 MED ORDER — NEOSTIGMINE METHYLSULFATE 5 MG/5ML IV SOSY
PREFILLED_SYRINGE | INTRAVENOUS | Status: DC | PRN
  Administered 2016-07-28: 3 mg via INTRAVENOUS

## 2016-07-28 MED ORDER — GLYCOPYRROLATE 0.2 MG/ML IV SOSY
PREFILLED_SYRINGE | INTRAVENOUS | Status: DC | PRN
  Administered 2016-07-28: 0.6 mg via INTRAVENOUS

## 2016-07-28 MED ORDER — PROPOFOL 10 MG/ML IV BOLUS
INTRAVENOUS | Status: DC | PRN
  Administered 2016-07-28: 150 mg via INTRAVENOUS

## 2016-07-28 MED ORDER — CELECOXIB 200 MG PO CAPS
ORAL_CAPSULE | ORAL | Status: AC
  Filled 2016-07-28: qty 2

## 2016-07-28 MED ORDER — SCOPOLAMINE 1 MG/3DAYS TD PT72
1.0000 | MEDICATED_PATCH | Freq: Once | TRANSDERMAL | Status: DC | PRN
  Administered 2016-07-28: 1.5 mg via TRANSDERMAL

## 2016-07-28 MED ORDER — SODIUM CHLORIDE 0.9 % IR SOLN
Status: DC | PRN
  Administered 2016-07-28: 1000 mL

## 2016-07-28 MED ORDER — PROMETHAZINE HCL 25 MG/ML IJ SOLN: 6.2500 mg | INTRAMUSCULAR | Status: DC | PRN

## 2016-07-28 SURGICAL SUPPLY — 79 items
BAG DECANTER FOR FLEXI CONT (MISCELLANEOUS) ×8 IMPLANT
BINDER ABDOMINAL 10 UNV 27-48 (MISCELLANEOUS) IMPLANT
BINDER ABDOMINAL 12 SM 30-45 (SOFTGOODS) IMPLANT
BINDER BREAST 3XL (BIND) IMPLANT
BINDER BREAST LRG (GAUZE/BANDAGES/DRESSINGS) IMPLANT
BINDER BREAST MEDIUM (GAUZE/BANDAGES/DRESSINGS) IMPLANT
BINDER BREAST XLRG (GAUZE/BANDAGES/DRESSINGS) ×4 IMPLANT
BINDER BREAST XXLRG (GAUZE/BANDAGES/DRESSINGS) IMPLANT
BLADE SURG 10 STRL SS (BLADE) ×8 IMPLANT
BLADE SURG 11 STRL SS (BLADE) ×4 IMPLANT
BNDG GAUZE ELAST 4 BULKY (GAUZE/BANDAGES/DRESSINGS) ×8 IMPLANT
CANISTER LIPO FAT HARVEST (MISCELLANEOUS) IMPLANT
CANISTER SUCT 1200ML W/VALVE (MISCELLANEOUS) ×8 IMPLANT
CHLORAPREP W/TINT 26ML (MISCELLANEOUS) ×4 IMPLANT
COMPRESSION GARMENT LG MOREWEL (MISCELLANEOUS) IMPLANT
COMPRESSION GARMENT MD MOREWEL (MISCELLANEOUS) IMPLANT
COMPRESSION GARMENT XL MOREWEL (MISCELLANEOUS) IMPLANT
COVER BACK TABLE 60X90IN (DRAPES) ×4 IMPLANT
COVER MAYO STAND STRL (DRAPES) ×4 IMPLANT
DECANTER SPIKE VIAL GLASS SM (MISCELLANEOUS) IMPLANT
DRAIN CHANNEL 15F RND FF W/TCR (WOUND CARE) IMPLANT
DRAPE TOP ARMCOVERS (MISCELLANEOUS) ×4 IMPLANT
DRAPE U-SHAPE 76X120 STRL (DRAPES) ×4 IMPLANT
DRSG PAD ABDOMINAL 8X10 ST (GAUZE/BANDAGES/DRESSINGS) ×8 IMPLANT
ELECT BLADE 4.0 EZ CLEAN MEGAD (MISCELLANEOUS) ×4
ELECT COATED BLADE 2.86 ST (ELECTRODE) ×4 IMPLANT
ELECT REM PT RETURN 9FT ADLT (ELECTROSURGICAL) ×4
ELECTRODE BLDE 4.0 EZ CLN MEGD (MISCELLANEOUS) ×2 IMPLANT
ELECTRODE REM PT RTRN 9FT ADLT (ELECTROSURGICAL) ×2 IMPLANT
EVACUATOR SILICONE 100CC (DRAIN) IMPLANT
FILTER LIPOSUCTION (MISCELLANEOUS) IMPLANT
GLOVE BIO SURGEON STRL SZ 6 (GLOVE) ×12 IMPLANT
GOWN STRL REUS W/ TWL LRG LVL3 (GOWN DISPOSABLE) ×4 IMPLANT
GOWN STRL REUS W/TWL LRG LVL3 (GOWN DISPOSABLE) ×4
IMPL BREAST SALINE HP 425CC (Breast) ×4 IMPLANT
IMPLANT BREAST SALINE HP 425CC (Breast) ×8 IMPLANT
IV NS 1000ML (IV SOLUTION) ×2
IV NS 1000ML BAXH (IV SOLUTION) ×2 IMPLANT
IV NS 500ML (IV SOLUTION)
IV NS 500ML BAXH (IV SOLUTION) IMPLANT
KIT FILL SYSTEM UNIVERSAL (SET/KITS/TRAYS/PACK) IMPLANT
LINER CANISTER 1000CC FLEX (MISCELLANEOUS) IMPLANT
LIQUID BAND (GAUZE/BANDAGES/DRESSINGS) ×8 IMPLANT
NDL SAFETY ECLIPSE 18X1.5 (NEEDLE) IMPLANT
NEEDLE HYPO 18GX1.5 SHARP (NEEDLE)
NEEDLE HYPO 25X1 1.5 SAFETY (NEEDLE) IMPLANT
NS IRRIG 1000ML POUR BTL (IV SOLUTION) ×4 IMPLANT
PACK BASIN DAY SURGERY FS (CUSTOM PROCEDURE TRAY) ×4 IMPLANT
PAD ALCOHOL SWAB (MISCELLANEOUS) IMPLANT
PENCIL BUTTON HOLSTER BLD 10FT (ELECTRODE) ×4 IMPLANT
PIN SAFETY STERILE (MISCELLANEOUS) IMPLANT
SHEET MEDIUM DRAPE 40X70 STRL (DRAPES) ×8 IMPLANT
SIZER BREAST SGL USE HP 425CC (SIZER) ×4
SIZER BREAST SGL USE HP 465CC (SIZER) ×4
SIZER BRST SGL USE HP 425CC (SIZER) ×2 IMPLANT
SIZER BRST SGL USE HP 465CC (SIZER) ×2 IMPLANT
SLEEVE SCD COMPRESS KNEE MED (MISCELLANEOUS) ×4 IMPLANT
SPONGE LAP 18X18 X RAY DECT (DISPOSABLE) ×8 IMPLANT
STAPLER VISISTAT 35W (STAPLE) ×4 IMPLANT
SUT ETHILON 2 0 FS 18 (SUTURE) IMPLANT
SUT MNCRL AB 4-0 PS2 18 (SUTURE) ×8 IMPLANT
SUT PDS AB 2-0 CT2 27 (SUTURE) IMPLANT
SUT VIC AB 3-0 PS1 18 (SUTURE)
SUT VIC AB 3-0 PS1 18XBRD (SUTURE) IMPLANT
SUT VIC AB 3-0 SH 27 (SUTURE) ×4
SUT VIC AB 3-0 SH 27X BRD (SUTURE) ×4 IMPLANT
SUT VICRYL 4-0 PS2 18IN ABS (SUTURE) ×8 IMPLANT
SYR 10ML LL (SYRINGE) IMPLANT
SYR 50ML LL SCALE MARK (SYRINGE) IMPLANT
SYR BULB IRRIGATION 50ML (SYRINGE) ×8 IMPLANT
SYR CONTROL 10ML LL (SYRINGE) IMPLANT
SYR TB 1ML LL NO SAFETY (SYRINGE) IMPLANT
TOWEL OR 17X24 6PK STRL BLUE (TOWEL DISPOSABLE) ×8 IMPLANT
TUBE CONNECTING 20'X1/4 (TUBING) ×2
TUBE CONNECTING 20X1/4 (TUBING) ×6 IMPLANT
TUBING INFILTRATION IT-10001 (TUBING) IMPLANT
TUBING SET GRADUATE ASPIR 12FT (MISCELLANEOUS) IMPLANT
UNDERPAD 30X30 (UNDERPADS AND DIAPERS) ×8 IMPLANT
YANKAUER SUCT BULB TIP NO VENT (SUCTIONS) ×4 IMPLANT

## 2016-07-28 NOTE — Op Note (Signed)
Operative Note   DATE OF OPERATION: 12.19.17  LOCATION: [facility]-outpatient  SURGICAL DIVISION: Plastic Surgery  PREOPERATIVE DIAGNOSES:  1. Acquired absence breasts 2.   POSTOPERATIVE DIAGNOSES:  same  PROCEDURE:  Removal bilateral tissue expanders and placement saline implants  SURGEON: Irene Limbo MD MBA  ASSISTANT: none  ANESTHESIA:  General.   EBL: 30 ml  COMPLICATIONS: None immediate.   INDICATIONS FOR PROCEDURE:  The patient, Michelle Cervantes, is a 35 y.o. female born on 1980/10/12, is here for second stage breast reconstruction following bilateral nipple sparing mastectomies and immediate expander based reconstruction.    FINDINGS: Natrelle Smooth Round Saline High Profile 425 ml, fill volume 455 ml bilateral. REF [id number] RIGHT SN [id number] LEFT SN [id number]  DESCRIPTION OF PROCEDURE:  The patient's operative site was marked with the patient in the preoperative area to mark chest midline, sternal notch, desired anterior axillary line. The patientwas taken to the operating room. SCDs were placed and IV antibiotics were given. The patient's operative site was prepped and draped in a sterile fashion. A time out was performed and all information was confirmed to be correct.Incision made in right inframammary mastectomy scar carried to pectoralis muscle. Muscle divided in direction of fibers. Expander removed. Superior capsulotomies performed. Sizer placed.  I then directed attention to left chest and incision made in inframammary scar and carried to pectoralis muscle. Muscle divided in direction of fibers. Expander removed. Superior capsulotomies performed. Sizer placed. The patient brought to upright sitting position and assessed for symmetry. A Natrelle High Profile 425 ml implant selected. Patient returned to supine position.   At this time left breast cavity irrigated with solution containing polymyxin and bacitracin, followed by Betadine. Implant placed  in left breast cavity and filled to 455 ml. Fill tubing removed and care taken to ensure proper orientation implant, closure fill tab. Closure was completed with running 3-0 vicryl for approximation of muscle. 4-0 vicryl was placed in dermis and running 4-0 monocryl was used to close skin. Following irrigation right chest cavity, hemostasis ensured. Implant placed in cavity and following fill to 455 ml, tubing removed. Orientation and closure fill tab ensured. Closure completed in similar fashion.  Tissue adhesive applied to breast incisions.Dry dressing and abdominal and breast binders applied.  The patient was allowed to wake from anesthesia, extubated and taken to the recovery room in satisfactory condition.   SPECIMENS: none  DRAINS: none  Irene Limbo, MD [facility] Plastic & Reconstructive Surgery [phone], pin [phone]

## 2016-07-28 NOTE — Anesthesia Procedure Notes (Signed)
Procedure Name: Intubation Date/Time: 07/28/2016 12:56 PM Performed by: Lyndee Leo Pre-anesthesia Checklist: Patient identified, Emergency Drugs available, Suction available and Patient being monitored Patient Re-evaluated:Patient Re-evaluated prior to inductionOxygen Delivery Method: Circle system utilized Preoxygenation: Pre-oxygenation with 100% oxygen Intubation Type: IV induction Ventilation: Mask ventilation without difficulty Laryngoscope Size: Miller and 2 Grade View: Grade II Tube type: Oral Tube size: 7.0 mm Number of attempts: 1 Airway Equipment and Method: Stylet and Oral airway Placement Confirmation: ETT inserted through vocal cords under direct vision,  positive ETCO2 and breath sounds checked- equal and bilateral Secured at: 21 cm Tube secured with: Tape Dental Injury: Teeth and Oropharynx as per pre-operative assessment

## 2016-07-28 NOTE — Anesthesia Preprocedure Evaluation (Addendum)
Anesthesia Evaluation  Patient identified by MRN, date of birth, ID band Patient awake    Reviewed: Allergy & Precautions, NPO status , Patient's Chart, lab work & pertinent test results  History of Anesthesia Complications Negative for: history of anesthetic complications  Airway Mallampati: II  TM Distance: >3 FB Neck ROM: Full    Dental  (+) Teeth Intact, Dental Advisory Given, Caps,    Pulmonary neg pulmonary ROS,    Pulmonary exam normal breath sounds clear to auscultation       Cardiovascular Exercise Tolerance: Good negative cardio ROS Normal cardiovascular exam Rhythm:Regular Rate:Normal     Neuro/Psych negative neurological ROS  negative psych ROS   GI/Hepatic negative GI ROS, Neg liver ROS,   Endo/Other  negative endocrine ROS  Renal/GU negative Renal ROS     Musculoskeletal negative musculoskeletal ROS (+)   Abdominal   Peds  Hematology negative hematology ROS (+)   Anesthesia Other Findings Day of surgery medications reviewed with the patient.  Breast cancer s/p mastectomy  Reproductive/Obstetrics negative OB ROS                            Anesthesia Physical Anesthesia Plan  ASA: II  Anesthesia Plan: General   Post-op Pain Management:    Induction: Intravenous  Airway Management Planned: Oral ETT  Additional Equipment:   Intra-op Plan:   Post-operative Plan: Extubation in OR  Informed Consent: I have reviewed the patients History and Physical, chart, labs and discussed the procedure including the risks, benefits and alternatives for the proposed anesthesia with the patient or authorized representative who has indicated his/her understanding and acceptance.   Dental advisory given  Plan Discussed with: CRNA  Anesthesia Plan Comments: (Risks/benefits of general anesthesia discussed with patient including risk of damage to teeth, lips, gum, and tongue,  nausea/vomiting, allergic reactions to medications, and the possibility of heart attack, stroke and death.  All patient questions answered.  Patient wishes to proceed.)        Anesthesia Quick Evaluation

## 2016-07-28 NOTE — Interval H&P Note (Signed)
History and Physical Interval Note:  07/28/2016 12:21 PM  Michelle Cervantes  has presented today for surgery, with the diagnosis of BRACA AND AQUIRED ABSENCE BREAST  The various methods of treatment have been discussed with the patient and family. After consideration of risks, benefits and other options for treatment, the patient has consented to  Procedure(s): REMOVAL OF BILATERAL TISSUE EXPANDERS WITH PLACEMENT OF BILATERAL BREAST IMPLANTS (Bilateral) POSSIBLE LIPOSUCTION WITH LIPOFILLING FROM ABDOMIN TO CHEST (N/A) as a surgical intervention .  The patient's history has been reviewed, patient examined, no change in status, stable for surgery.  I have reviewed the patient's chart and labs.  Questions were answered to the patient's satisfaction.     Keyonta Barradas

## 2016-07-28 NOTE — Transfer of Care (Signed)
Immediate Anesthesia Transfer of Care Note  Patient: Michelle Cervantes  Procedure(s) Performed: Procedure(s): REMOVAL OF BILATERAL TISSUE EXPANDERS WITH PLACEMENT OF BILATERAL SALINE BREAST IMPLANTS (Bilateral)  Patient Location: PACU  Anesthesia Type:General  Level of Consciousness: sedated and patient cooperative  Airway & Oxygen Therapy: Patient Spontanous Breathing and Patient connected to face mask oxygen  Post-op Assessment: Report given to RN and Post -op Vital signs reviewed and stable  Post vital signs: Reviewed and stable  Last Vitals:  Vitals:   07/28/16 1015  BP: 104/67  Pulse: 62  Resp: 18  Temp: 36.6 C    Last Pain:  Vitals:   07/28/16 1015  TempSrc: Oral         Complications: No apparent anesthesia complications

## 2016-07-28 NOTE — Discharge Instructions (Signed)

## 2016-07-28 NOTE — Anesthesia Postprocedure Evaluation (Signed)
Anesthesia Post Note  Patient: RANITA SPRINGER  Procedure(s) Performed: Procedure(s) (LRB): REMOVAL OF BILATERAL TISSUE EXPANDERS WITH PLACEMENT OF BILATERAL SALINE BREAST IMPLANTS (Bilateral)  Patient location during evaluation: PACU Anesthesia Type: General Level of consciousness: awake and alert Pain management: pain level controlled Vital Signs Assessment: post-procedure vital signs reviewed and stable Respiratory status: spontaneous breathing, nonlabored ventilation, respiratory function stable and patient connected to nasal cannula oxygen Cardiovascular status: blood pressure returned to baseline and stable Postop Assessment: no signs of nausea or vomiting Anesthetic complications: no       Last Vitals:  Vitals:   07/28/16 1500 07/28/16 1505  BP: 101/70   Pulse: 69 63  Resp: (!) 8 12  Temp:      Last Pain:  Vitals:   07/28/16 1500  TempSrc:   PainSc: 2                  Catalina Gravel

## 2016-07-28 NOTE — Interval H&P Note (Signed)
History and Physical Interval Note:  07/28/2016 12:21 PM  Michelle Cervantes  has presented today for surgery, with the diagnosis of BRACA AND AQUIRED ABSENCE BREAST  The various methods of treatment have been discussed with the patient and family. After consideration of risks, benefits and other options for treatment, the patient has consented to  Procedure(s): REMOVAL OF BILATERAL TISSUE EXPANDERS WITH PLACEMENT OF BILATERAL BREAST IMPLANTS (Bilateral) POSSIBLE LIPOSUCTION WITH LIPOFILLING FROM ABDOMIN TO CHEST (N/A) as a surgical intervention .  The patient's history has been reviewed, patient examined, no change in status, stable for surgery.  I have reviewed the patient's chart and labs.  Questions were answered to the patient's satisfaction.     Macyn Shropshire

## 2016-07-29 ENCOUNTER — Encounter (HOSPITAL_BASED_OUTPATIENT_CLINIC_OR_DEPARTMENT_OTHER): Payer: Self-pay | Admitting: Plastic Surgery

## 2016-09-22 ENCOUNTER — Encounter: Payer: Self-pay | Admitting: Gastroenterology

## 2016-09-22 ENCOUNTER — Other Ambulatory Visit: Payer: Self-pay

## 2016-09-22 ENCOUNTER — Ambulatory Visit (INDEPENDENT_AMBULATORY_CARE_PROVIDER_SITE_OTHER): Payer: BLUE CROSS/BLUE SHIELD | Admitting: Gastroenterology

## 2016-09-22 VITALS — BP 105/69 | HR 93 | Temp 99.8°F | Ht 62.0 in | Wt 153.0 lb

## 2016-09-22 DIAGNOSIS — R748 Abnormal levels of other serum enzymes: Secondary | ICD-10-CM | POA: Diagnosis not present

## 2016-09-22 NOTE — Progress Notes (Signed)
Gastroenterology Consultation  Referring Provider:     Dr. Barnett Applebaum Primary Care Physician:  No PCP Per Patient Primary Gastroenterologist:  Dr. Allen Norris     Reason for Consultation:     Abnormal liver enzymes        HPI:   Michelle Cervantes is a 36 y.o. y/o female referred for consultation & management of Abnormal liver enzymes by Dr. Rayne Du PCP Per Patient.  This patient comes in today with a report of not feeling well for the last few weeks. The patient states that she was seen by Dr. Kenton Kingfisher and was found to have abnormal liver enzymes. The patient also has a history of having a colonoscopy by a surgeon with a adenomatous polyp found and she has a family history of colon cancer in her father. The patient had genetic testing and was found to be at risk for breast cancer and colon cancer in addition to pancreatic cancer. The patient was found to have the BRCA1 gene and the [facility] gene. 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 The patient had labwork that showed her alkaline phosphatase to be elevated at 263 with her AST of 362 and an ALT of 571. She reports that she had been taking Advil and Tylenol but not in very large doses.  Past Medical History:  Diagnosis Date  . BRCA1 positive       . Canker sore 05/19/2016   inside mouth    Past Surgical History:  Procedure Laterality Date  . BREAST RECONSTRUCTION WITH PLACEMENT OF TISSUE EXPANDER AND FLEX HD (ACELLULAR HYDRATED DERMIS) Bilateral 05/22/2016   Procedure: BILATERAL BREAST RECONSTRUCTION WITH PLACEMENT OF TISSUE EXPANDER AND POSSIBLE CORTIVA;  Surgeon: Irene Limbo, MD;  Location: [place];  Service: Plastics;  Laterality: Bilateral;  . CESAREAN SECTION N/A 01/19/2015   Procedure: CESAREAN SECTION;  Surgeon: Gae Dry, MD;  Location: [facility];  Service: Obstetrics;  Laterality: N/A;  . COLONOSCOPY WITH PROPOFOL N/A 01/07/2016   Procedure: COLONOSCOPY WITH PROPOFOL;  Surgeon: Christene Lye, MD;   Location: [facility];  Service: Endoscopy;  Laterality: N/A;  . LAPAROSCOPIC HYSTERECTOMY Bilateral 11/28/2015   Procedure: HYSTERECTOMY TOTAL LAPAROSCOPIC/BSO;  Surgeon: Gae Dry, MD;  Location: [facility];  Service: Gynecology;  Laterality: Bilateral;  . NASAL SINUS SURGERY    . NIPPLE SPARING MASTECTOMY Bilateral 05/22/2016   Procedure: BILATERAL PROPHYLACTIC NIPPLE SPARING MASTECTOMY;  Surgeon: Rolm Bookbinder, MD;  Location: [place];  Service: General;  Laterality: Bilateral;  . REMOVAL OF BILATERAL TISSUE EXPANDERS WITH PLACEMENT OF BILATERAL BREAST IMPLANTS Bilateral 07/28/2016   Procedure: REMOVAL OF BILATERAL TISSUE EXPANDERS WITH PLACEMENT OF BILATERAL SALINE BREAST IMPLANTS;  Surgeon: Irene Limbo, MD;  Location: [place];  Service: Plastics;  Laterality: Bilateral;    Prior to Admission medications   Medication Sig Start Date End Date Taking? Authorizing Provider  acetaminophen (TYLENOL) 325 MG tablet Take by mouth.    Historical Provider, MD  clindamycin (CLEOCIN) 300 MG capsule Take 1 capsule (300 mg total) by mouth 3 (three) times daily. Patient not taking: Reported on 09/22/2016 07/28/16   Irene Limbo, MD  HYDROcodone-acetaminophen (NORCO) 5-325 MG tablet Take 1-2 tablets by mouth every 6 (six) hours as needed for moderate pain. Patient not taking: Reported on 09/22/2016 07/28/16   Irene Limbo, MD  ibuprofen (ADVIL,MOTRIN) 600 MG tablet Take by mouth. 01/22/15   Historical Provider, MD    Family History  Problem Relation Age of Onset  .  Arthritis Mother   . Heart disease Mother   . Hypertension Mother   . Miscarriages / [place] Mother   . Vision loss Mother   . Alcohol abuse Father   . Arthritis Father   . Cancer Father 69    Colon  . Hyperlipidemia Father   . Vision loss Father   . Varicose Veins Father   . Hyperlipidemia Brother   . Vision loss Brother   . Diabetes Maternal Aunt   . Heart disease  Maternal Aunt   . Kidney disease Maternal Aunt   . Cancer Maternal Aunt     Breast  . Diabetes Maternal Uncle   . Cancer Paternal Aunt   . Early death Paternal 17   . Varicose Veins Paternal Aunt   . Breast cancer Paternal Aunt   . Diabetes Maternal Grandmother   . Cancer Maternal Grandfather   . Stroke Paternal Grandmother   . Vision loss Paternal Grandmother   . Varicose Veins Paternal Grandmother   . Alcohol abuse Paternal Grandfather   . Cancer Paternal Grandfather      Social History  Substance Use Topics  . Smoking status: Never Smoker  . Smokeless tobacco: Never Used  . Alcohol use Yes     Comment: occasionally    Allergies as of 09/22/2016 - Review Complete 09/22/2016  Allergen Reaction Noted  . Ceclor [cefaclor] Rash 12/10/2014  . Duravent dm [phenylephrine-dm-gg] Rash 12/10/2014  . Macrolides and ketolides Rash 12/10/2014  . Penicillins Rash 12/10/2014  . Phenylephrine Rash 04/01/2016  . Sulfa antibiotics Rash 12/10/2014  . Sulfamethoxazole Rash 03/31/2016    Review of Systems:    All systems reviewed and negative except where noted in HPI.   Physical Exam:  BP 105/69   Pulse 93   Temp 99.8 F (37.7 C) (Oral)   Ht _0  (1.575 m)   Wt 153 lb (69.4 kg)   LMP 10/28/2015 (Exact Date)   BMI 27.98 kg/m  Patient's last menstrual period was 10/28/2015 (exact date). Psych:  Alert and cooperative. Normal mood and affect. General:   Alert,  Well-developed, well-nourished, pleasant and cooperative in NAD Head:  Normocephalic and atraumatic. Eyes:  Sclera clear, no icterus.   Conjunctiva pink. Ears:  Normal auditory acuity. Nose:  No deformity, discharge, or lesions. Mouth:  No deformity or lesions,oropharynx pink & moist. Neck:  Supple; no masses or thyromegaly. Lungs:  Respirations even and unlabored.  Clear throughout to auscultation.   No wheezes, crackles, or rhonchi. No acute distress. Heart:  Regular rate and rhythm; no murmurs, clicks, rubs, or  gallops. Abdomen:  Normal bowel sounds.  No bruits.  Soft, non-tender and non-distended without masses, hepatosplenomegaly or hernias noted.  No guarding or rebound tenderness.  Negative Carnett sign.   Rectal:  Deferred.  Msk:  Symmetrical without gross deformities.  Good, equal movement & strength bilaterally. Pulses:  Normal pulses noted. Extremities:  No clubbing or edema.  No cyanosis. Neurologic:  Alert and oriented x3;  grossly normal neurologically. Skin:  Intact without significant lesions or rashes.  No jaundice. Lymph Nodes:  No significant cervical adenopathy. Psych:  Alert and cooperative. Normal mood and affect.  Imaging Studies: No results found.  Assessment and Plan:   Michelle Cervantes is a 36 y.o. y/o female with a increased risk of pancreatic cancer and colorectal cancer with a colonoscopy done last year with a adenomatous polyp. The patient should have a repeat colonoscopy in 3 years after that last colonoscopy. The patient will  now be set up for a abdominal ultrasound for her abnormal liver enzymes and she will also be set up for labs to look for possible causes of her abnormal liver enzymes. She will also have her LFTs checked again. As been explained the plan and agrees with it    Lucilla Lame, MD. Marval Regal   Note: This dictation was prepared with Dragon dictation along with smaller phrase technology. Any transcriptional errors that result from this process are unintentional.

## 2016-09-22 NOTE — Patient Instructions (Signed)
You are scheduled for a RUQ abdominal US at [facility] location on Wednesday, Feb 14th at 9:30am. Please arrive at 9:15am.   You cannot have anything to eat or drink after midnight tonight.

## 2016-09-23 ENCOUNTER — Ambulatory Visit
Admission: RE | Admit: 2016-09-23 | Discharge: 2016-09-23 | Disposition: A | Discharge status: Home / Self Care | Payer: BLUE CROSS/BLUE SHIELD | Source: Ambulatory Visit | Attending: Gastroenterology | Admitting: Gastroenterology

## 2016-09-23 DIAGNOSIS — R748 Abnormal levels of other serum enzymes: Secondary | ICD-10-CM | POA: Diagnosis not present

## 2016-09-23 DIAGNOSIS — K824 Cholesterolosis of gallbladder: Secondary | ICD-10-CM | POA: Insufficient documentation

## 2016-09-24 LAB — HEPATITIS PANEL, ACUTE
HEP B C IGM: NEGATIVE
Hep A IgM: NEGATIVE
Hepatitis B Surface Ag: NEGATIVE

## 2016-09-24 LAB — ALPHA-1-ANTITRYPSIN: A1 ANTITRYPSIN: 176 mg/dL (ref 90–200)

## 2016-09-24 LAB — CMV ABS, IGG+IGM (CYTOMEGALOVIRUS)
CMV Ab - IgG: 4 U/mL — ABNORMAL HIGH (ref 0.00–0.59)
CMV IgM Ser EIA-aCnc: >240 AU/mL — ABNORMAL HIGH (ref 0.0–29.9)

## 2016-09-24 LAB — CERULOPLASMIN: CERULOPLASMIN: 43.3 mg/dL — AB (ref 19.0–39.0)

## 2016-09-24 LAB — EPSTEIN-BARR VIRUS VCA ANTIBODY PANEL
EBV Early Antigen Ab, IgG: 101 U/mL — ABNORMAL HIGH (ref 0.0–8.9)
EBV NA IgG: <18 U/mL (ref 0.0–17.9)
EBV VCA IGG: 141 U/mL — AB (ref 0.0–17.9)
EBV VCA IgM: 100 U/mL — ABNORMAL HIGH (ref 0.0–35.9)

## 2016-09-24 LAB — MITOCHONDRIAL ANTIBODIES: MITOCHONDRIAL AB: 8.3 U (ref 0.0–20.0)

## 2016-09-24 LAB — ANTI-SMOOTH MUSCLE ANTIBODY, IGG: Smooth Muscle Ab: 34 Units — ABNORMAL HIGH (ref 0–19)

## 2016-09-24 LAB — ANA: ANA: NEGATIVE

## 2016-09-25 ENCOUNTER — Telehealth: Payer: Self-pay

## 2016-09-25 ENCOUNTER — Other Ambulatory Visit: Payer: Self-pay

## 2016-09-25 ENCOUNTER — Encounter: Payer: Self-pay | Admitting: Gastroenterology

## 2016-09-25 DIAGNOSIS — B259 Cytomegaloviral disease, unspecified: Secondary | ICD-10-CM

## 2016-09-25 LAB — HEPATIC FUNCTION PANEL
ALBUMIN: 4.2 g/dL (ref 3.5–5.5)
ALK PHOS: 329 IU/L — AB (ref 39–117)
ALT: 566 IU/L (ref 0–32)
AST: 293 IU/L — AB (ref 0–40)
BILIRUBIN TOTAL: 0.8 mg/dL (ref 0.0–1.2)
BILIRUBIN, DIRECT: 0.18 mg/dL (ref 0.00–0.40)
Total Protein: 7.7 g/dL (ref 6.0–8.5)

## 2016-09-25 LAB — SPECIMEN STATUS REPORT

## 2016-09-25 LAB — TOXOPLASMA GONDII ANTIBODY, IGG: Toxoplasma IgG Ratio: <3 IU/mL (ref 0.0–7.1)

## 2016-09-25 NOTE — Telephone Encounter (Signed)
-----   Message from Michelle Lame, MD sent at 09/24/2016  7:20 PM EST ----- I spoke to this patient and told her her results.  She now needs to have a CMV and EBV titers/viral load/PCR

## 2016-09-25 NOTE — Telephone Encounter (Signed)
Spoke with pt and advised her there are new labs Dr. Allen Norris would like to order. Pt cannot go today due to work. She will go Monday.

## 2016-09-25 NOTE — Telephone Encounter (Signed)
Pt notified results were negative.  

## 2016-09-25 NOTE — Telephone Encounter (Signed)
-----   Message from Lucilla Lame, MD sent at 09/25/2016  1:46 PM EST ----- Let patient know toxoplasmosis was negative.

## 2016-09-28 ENCOUNTER — Other Ambulatory Visit: Payer: Self-pay

## 2016-09-28 DIAGNOSIS — B259 Cytomegaloviral disease, unspecified: Secondary | ICD-10-CM

## 2016-09-30 ENCOUNTER — Telehealth: Payer: Self-pay | Admitting: Gastroenterology

## 2016-09-30 LAB — HEPATIC FUNCTION PANEL
ALK PHOS: 258 IU/L — AB (ref 39–117)
ALT: 567 IU/L (ref 0–32)
AST: 364 IU/L — ABNORMAL HIGH (ref 0–40)
Albumin: 4.4 g/dL (ref 3.5–5.5)
BILIRUBIN TOTAL: 0.7 mg/dL (ref 0.0–1.2)
BILIRUBIN, DIRECT: 0.17 mg/dL (ref 0.00–0.40)
TOTAL PROTEIN: 7.6 g/dL (ref 6.0–8.5)

## 2016-09-30 LAB — CMV DNA, QUANTITATIVE, PCR
CMV DNA Quant: 2898 IU/mL
Log10 CMV Qn DNA Pl: 3.462

## 2016-09-30 LAB — EPSTEIN BARR VRS(EBV DNA BY PCR): EPSTEIN-BARR DNA QUANT, PCR: NEGATIVE {copies}/mL

## 2016-09-30 NOTE — Telephone Encounter (Signed)
Results have not returned. Will contact pt when available.

## 2016-09-30 NOTE — Telephone Encounter (Signed)
results

## 2016-10-01 ENCOUNTER — Other Ambulatory Visit: Payer: Self-pay

## 2016-10-01 ENCOUNTER — Telehealth: Payer: Self-pay

## 2016-10-01 ENCOUNTER — Encounter: Payer: Self-pay | Admitting: Gastroenterology

## 2016-10-01 DIAGNOSIS — B251 Cytomegaloviral hepatitis: Secondary | ICD-10-CM

## 2016-10-01 NOTE — Telephone Encounter (Signed)
Pt returned call an notified of labs and [place] results. Pt will repeat liver enzymes next week.

## 2016-10-01 NOTE — Telephone Encounter (Signed)
LVM for pt to return my call.

## 2016-10-01 NOTE — Telephone Encounter (Signed)
-----   Message from Lucilla Lame, MD sent at 09/30/2016 12:26 PM EST ----- Let the patient know that the ultrasound showed a small gallbladder polyp but otherwise was normal.  The polyp in the gallbladder is nothing to worry about.

## 2016-10-01 NOTE — Telephone Encounter (Signed)
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  This should be a self-limiting disease which will go away on its own.  Recheck her liver enzymes in one week.

## 2017-02-28 IMAGING — MR MR BREAST BX W LOC DEV 1ST LESION IMAGE BX SPEC MR GUIDE*L*
9 of 14 series · 32 of 48 positions shown · IV contrast (13ml Multihance)
Comparison: Previous exams.

ADDENDUM:
Pathology revealed benign breast parenchyma from the left breast
biopsy. This was found to be discordant by Dr. Henrii Jorba and
excision is recommended. Pathology results were discussed with the
patient by telephone. The patient reported doing well after the
biopsy with tenderness at the site. Post biopsy instructions and
care were reviewed and questions were answered. The patient was
encouraged to call The [REDACTED] for any
additional concerns. Pathology results were called to Dr. Noronha
[REDACTED], who will arrange for surgical consultation. The
patient is aware of the need for a wire localized excision. In light
of her BRCA positive status, she is considering surgical
consultation for bilateral mastectomy. She stated that she may want
consultation in [HOSPITAL], and has my number for future assistance.

Pathology results reported by Nala RN, BSN on 01/15/2016.
CLINICAL DATA: 9 mm area of non mass enhancement in the posterior
aspect of the medial left breast on a recent screening MRI. The
patient is BRCA 1 positive and has a family history of breast
cancer.
EXAM:
MRI GUIDED CORE NEEDLE BIOPSY OF THE LEFT BREAST
TECHNIQUE: Multiplanar, multisequence MR imaging of the left breast was
performed both before and after administration of intravenous
contrast.
CONTRAST:  13mL MULTIHANCE GADOBENATE DIMEGLUMINE 529 MG/ML IV SOLN

[Series 3: axial pre-cm · axial · non-contrast · 1.3mm · 0.73mm/px · z∈[-62,+124]mm · 4 of 144 slices shown]
[im 1/144]
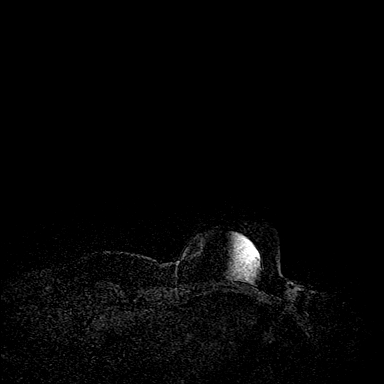
[im 48/144]
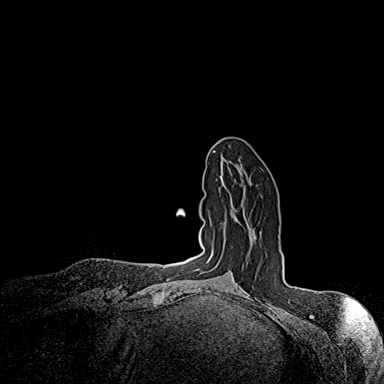
[im 96/144]
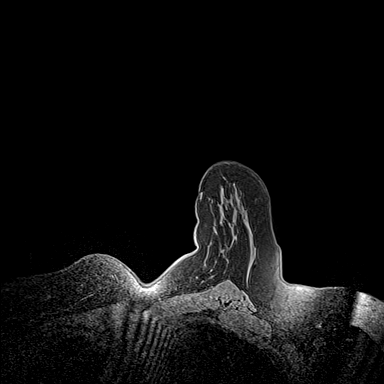
[im 144/144]
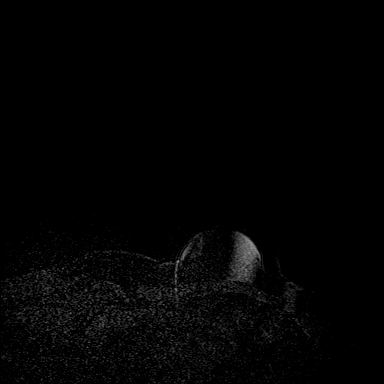

[Series 4: axial pre-cm_s3_(id) · axial · non-contrast · 1.3mm · 0.73mm/px · z∈[-62,+124]mm · 4 of 144 slices shown]
[im 1/144]
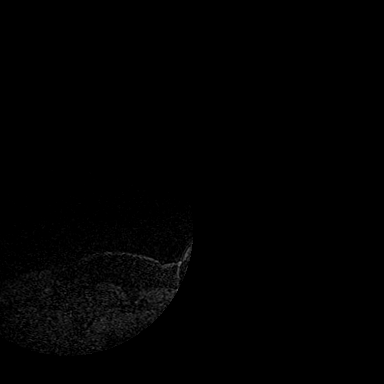
[im 48/144]
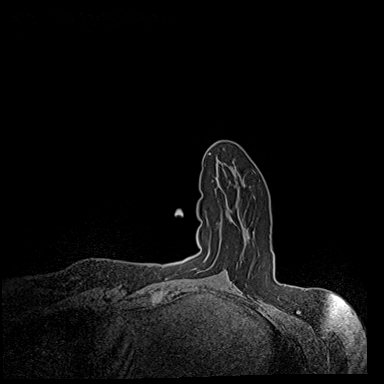
[im 96/144]
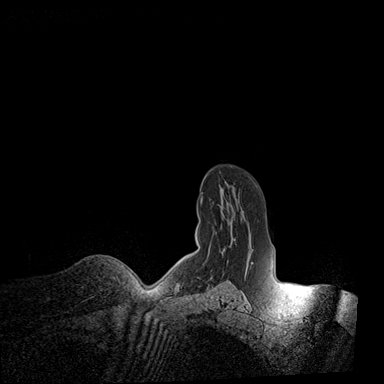
[im 144/144]
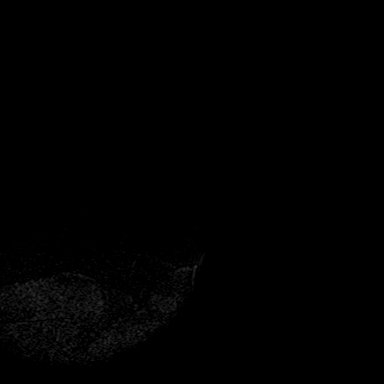

[Series 5: axial post 20 · axial · 1.3mm · 0.73mm/px · z∈[-62,+124]mm · 4 of 144 slices shown (1 of 3)]
[im 1/144]
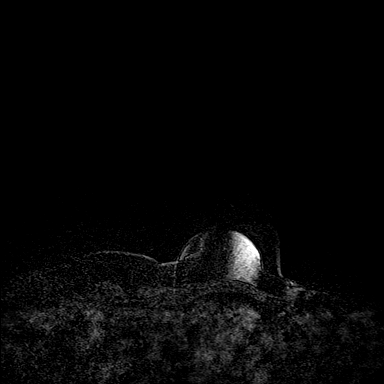
[im 48/144]
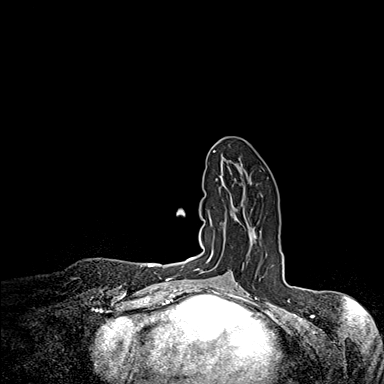
[im 96/144]
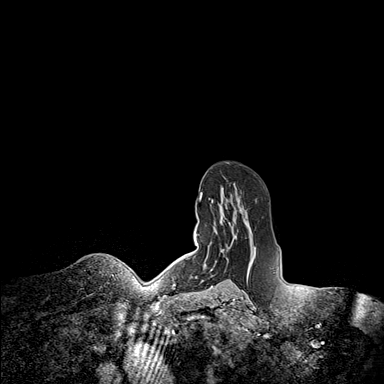
[im 144/144]
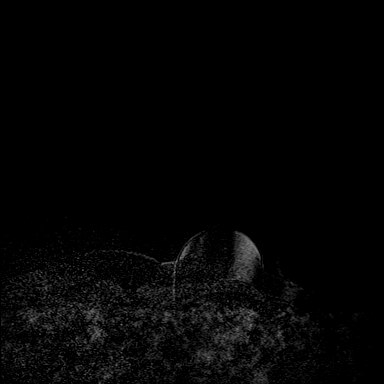

[Series 6: axial post 20 · axial · 1.3mm · 0.73mm/px · z∈[-62,+124]mm · 4 of 144 slices shown (2 of 3)]
[im 1/144]
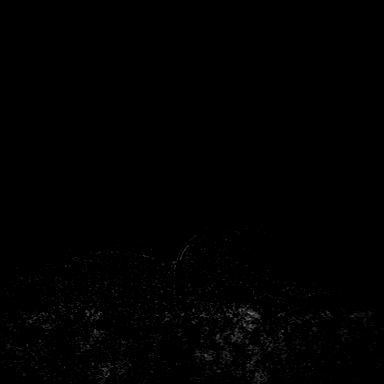
[im 48/144]
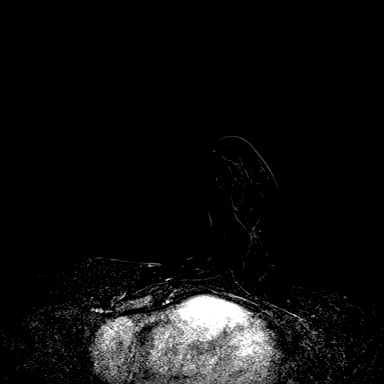
[im 96/144]
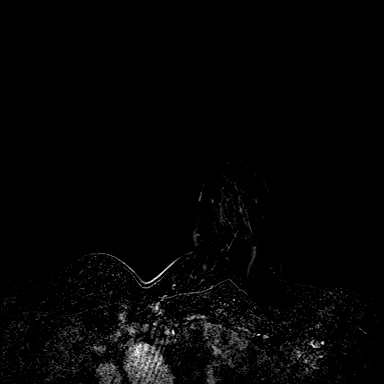
[im 144/144]
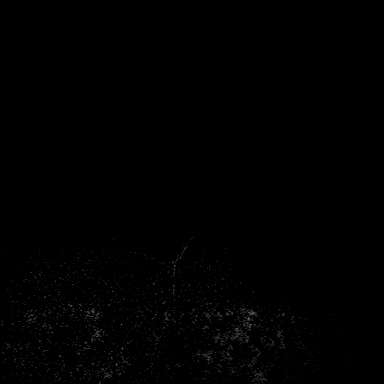

[Series 7: axial post 20 · axial · 1.3mm · 0.73mm/px · z∈[-62,+124]mm · 4 of 144 slices shown (3 of 3)]
[im 1/144]
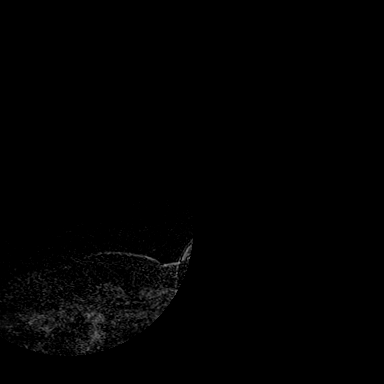
[im 48/144]
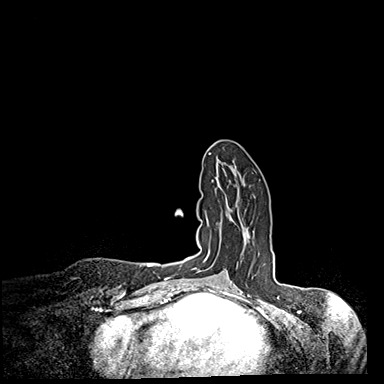
[im 96/144]
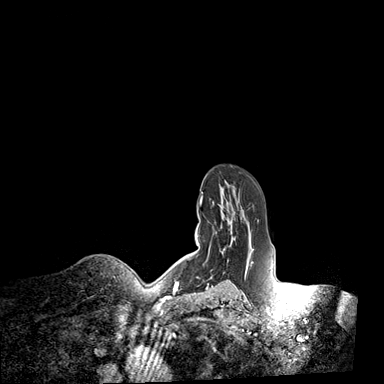
[im 144/144]
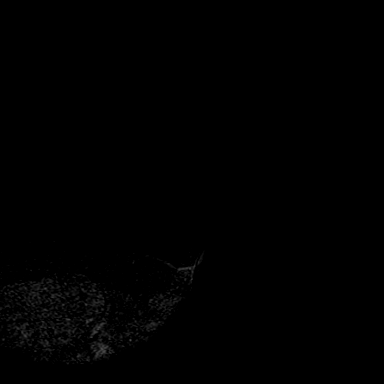

[Series 8: axial post 3 · axial · 1.3mm · 0.73mm/px · z∈[-62,+124]mm · 4 of 144 slices shown (1 of 3)]
[im 1/144]
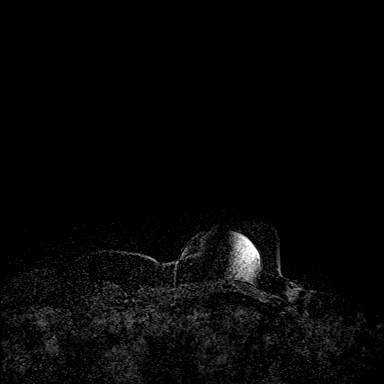
[im 48/144]
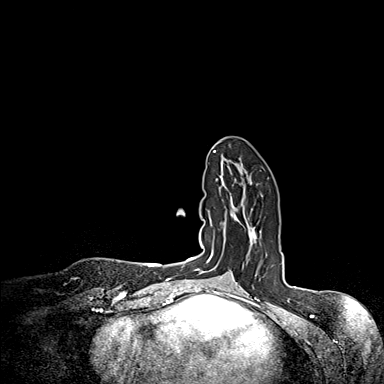
[im 96/144]
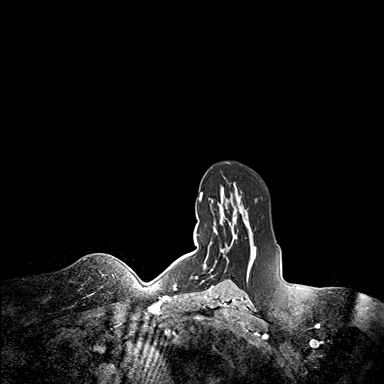
[im 144/144]
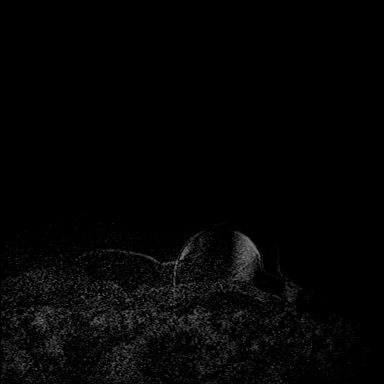

[Series 9: axial post 3 · axial · 1.3mm · 0.73mm/px · z∈[-62,+124]mm · 3 of 144 slices shown (2 of 3)]
[im 1/144]
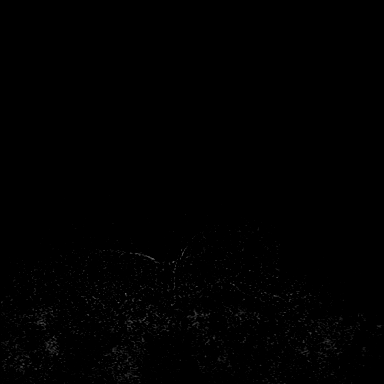
[im 72/144]
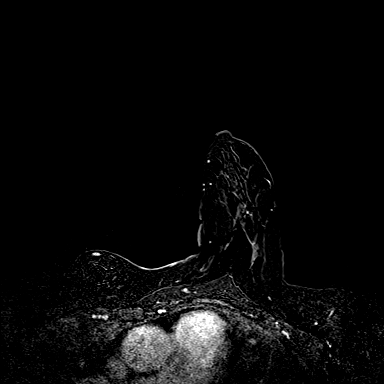
[im 144/144]
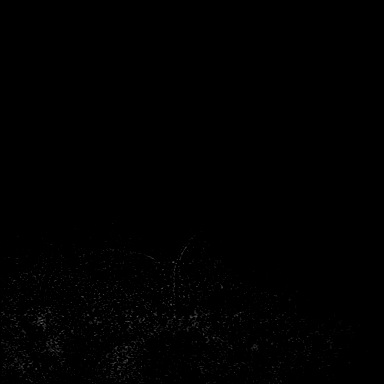

[Series 10: axial post 3 · axial · 1.3mm · 0.73mm/px · z∈[-62,+124]mm · 3 of 144 slices shown (3 of 3)]
[im 1/144]
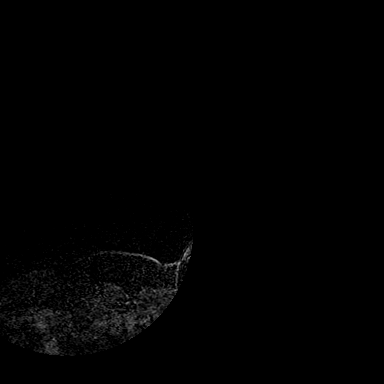
[im 72/144]
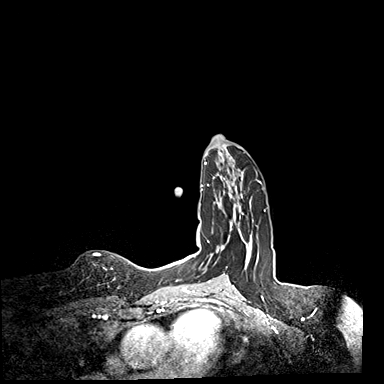
[im 144/144]
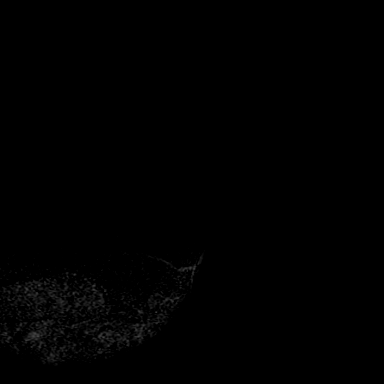

[Series 11: axial confirmation · axial · 1.3mm · 0.73mm/px · z∈[-62,+31]mm · 2 of 144 slices shown]
[im 1/144]
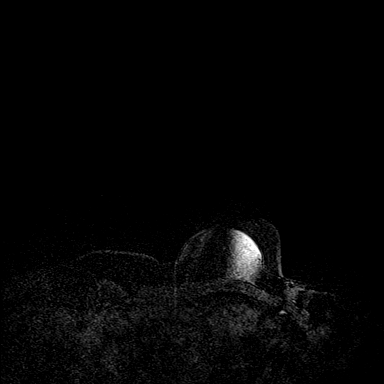
[im 72/144]
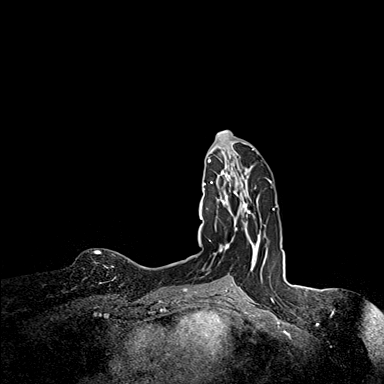

[32 of 48 positions shown; findings below may reference images not displayed]

FINDINGS: I met with the patient, and we discussed the procedure of MRI guided
biopsy, including risks, benefits, and alternatives. Specifically,
we discussed the risks of infection, bleeding, tissue injury, clip
migration, and inadequate sampling. Informed, written consent was
given. The usual time out protocol was performed immediately prior
to the procedure.

Using sterile technique, 1% Lidocaine, MRI guidance, and a 9 gauge
vacuum assisted device, biopsy was performed of the recently
demonstrated 9 mm area of non mass enhancement in the posterior
aspect of the medial left breast using a medial approach. With the
patient positioned in the biopsy device, the 9 mm area of non mass
enhancement is in the upper inner quadrant, in the 10 o'clock
position. At the conclusion of the procedure, a dumbbell-shaped
tissue marker clip was deployed into the biopsy cavity. Follow-up
2-view mammogram was performed and dictated separately.
IMPRESSION: MRI guided biopsy of a 9 mm area of non mass enhancement in the
upper inner quadrant of the left breast. No apparent complications.

## 2017-03-11 ENCOUNTER — Other Ambulatory Visit: Payer: Self-pay

## 2017-03-11 ENCOUNTER — Telehealth: Payer: Self-pay | Admitting: Gastroenterology

## 2017-03-11 DIAGNOSIS — B251 Cytomegaloviral hepatitis: Secondary | ICD-10-CM

## 2017-03-11 NOTE — Telephone Encounter (Signed)
Patient would like for you to return her call. Does she still have active lab work? Please call

## 2017-03-11 NOTE — Telephone Encounter (Signed)
Labs have been ordered

## 2017-03-12 LAB — CBC WITH DIFFERENTIAL/PLATELET
BASOS ABS: 0 10*3/uL (ref 0.0–0.2)
BASOS: 0 %
EOS (ABSOLUTE): 0.1 10*3/uL (ref 0.0–0.4)
Eos: 1 %
Hematocrit: 39.9 % (ref 34.0–46.6)
Hemoglobin: 13.4 g/dL (ref 11.1–15.9)
IMMATURE GRANS (ABS): 0 10*3/uL (ref 0.0–0.1)
IMMATURE GRANULOCYTES: 0 %
Lymphocytes Absolute: 3.4 10*3/uL — ABNORMAL HIGH (ref 0.7–3.1)
Lymphs: 55 %
MCH: 30.2 pg (ref 26.6–33.0)
MCHC: 33.6 g/dL (ref 31.5–35.7)
MCV: 90 fL (ref 79–97)
Monocytes Absolute: 0.5 10*3/uL (ref 0.1–0.9)
Monocytes: 8 %
NEUTROS PCT: 36 %
Neutrophils Absolute: 2.2 10*3/uL (ref 1.4–7.0)
PLATELETS: 273 10*3/uL (ref 150–379)
RBC: 4.43 x10E6/uL (ref 3.77–5.28)
RDW: 13.8 % (ref 12.3–15.4)
WBC: 6.2 10*3/uL (ref 3.4–10.8)

## 2017-03-12 LAB — HEPATIC FUNCTION PANEL
ALBUMIN: 5 g/dL (ref 3.5–5.5)
ALT: 123 IU/L — ABNORMAL HIGH (ref 0–32)
AST: 89 IU/L — AB (ref 0–40)
Alkaline Phosphatase: 105 IU/L (ref 39–117)
Bilirubin Total: 1.6 mg/dL — ABNORMAL HIGH (ref 0.0–1.2)
Bilirubin, Direct: 0.29 mg/dL (ref 0.00–0.40)
Total Protein: 8 g/dL (ref 6.0–8.5)

## 2017-03-16 ENCOUNTER — Telehealth: Payer: Self-pay

## 2017-03-16 LAB — CMV DNA, QUANTITATIVE, PCR: CMV DNA Quant: NEGATIVE IU/mL

## 2017-03-16 NOTE — Telephone Encounter (Signed)
LVM for pt to return my call to schedule liver biopsy.

## 2017-03-16 NOTE — Telephone Encounter (Signed)
-----   Message from Lucilla Lame, MD sent at 03/15/2017  6:40 AM EDT ----- Left the patient know that her liver enzymes are significantly lower than they were 5 months ago but still significantly elevated.  She should be set up for a liver biopsy to look for a possible cause of her continued abnormal liver enzymes.

## 2017-03-19 NOTE — Telephone Encounter (Signed)
-----   Message from Lucilla Lame, MD sent at 03/17/2017  7:59 AM EDT ----- Let the patient know that her CMV DNA has returned to negative which means she is not infected anymore.

## 2017-03-19 NOTE — Telephone Encounter (Signed)
Pt has returned my call and advised of the liver biopsy. She would like to think about scheduling the biopsy and will call me back.
